# Patient Record
Sex: Female | Born: 1965 | Race: Black or African American | Hispanic: No | Marital: Married | Smoking: Never smoker
Health system: Southern US, Community
[De-identification: ages and names within clinical notes are randomized; demographics above are authoritative.]

## PROBLEM LIST (undated history)

## (undated) DIAGNOSIS — K219 Gastro-esophageal reflux disease without esophagitis: Secondary | ICD-10-CM

## (undated) DIAGNOSIS — E119 Type 2 diabetes mellitus without complications: Secondary | ICD-10-CM

## (undated) DIAGNOSIS — Z789 Other specified health status: Secondary | ICD-10-CM

## (undated) HISTORY — DX: Type 2 diabetes mellitus without complications: E11.9

## (undated) HISTORY — PX: TONSILLECTOMY: SUR1361

---

## 2001-05-17 ENCOUNTER — Emergency Department (HOSPITAL_COMMUNITY): Admission: EM | Admit: 2001-05-17 | Discharge: 2001-05-17 | Payer: Self-pay | Admitting: Emergency Medicine

## 2001-05-17 ENCOUNTER — Encounter: Payer: Self-pay | Admitting: Emergency Medicine

## 2002-06-24 ENCOUNTER — Emergency Department (HOSPITAL_COMMUNITY): Admission: EM | Admit: 2002-06-24 | Discharge: 2002-06-24 | Payer: Self-pay | Admitting: Emergency Medicine

## 2004-12-08 ENCOUNTER — Emergency Department (HOSPITAL_COMMUNITY): Admission: EM | Admit: 2004-12-08 | Discharge: 2004-12-08 | Payer: Self-pay | Admitting: Emergency Medicine

## 2006-12-03 ENCOUNTER — Ambulatory Visit: Payer: Self-pay | Admitting: Family Medicine

## 2006-12-07 ENCOUNTER — Encounter: Admission: RE | Admit: 2006-12-07 | Discharge: 2006-12-07 | Payer: Self-pay | Admitting: Family Medicine

## 2007-01-04 ENCOUNTER — Ambulatory Visit: Payer: Self-pay | Admitting: Family Medicine

## 2007-09-21 ENCOUNTER — Ambulatory Visit: Payer: Self-pay | Admitting: Family Medicine

## 2007-12-23 ENCOUNTER — Ambulatory Visit: Payer: Self-pay | Admitting: Family Medicine

## 2008-02-02 ENCOUNTER — Encounter: Payer: Self-pay | Admitting: Family Medicine

## 2008-02-02 ENCOUNTER — Ambulatory Visit: Payer: Self-pay | Admitting: Family Medicine

## 2008-02-02 ENCOUNTER — Other Ambulatory Visit: Admission: RE | Admit: 2008-02-02 | Discharge: 2008-02-02 | Payer: Self-pay | Admitting: Family Medicine

## 2008-03-16 ENCOUNTER — Ambulatory Visit: Payer: Self-pay | Admitting: Family Medicine

## 2008-09-06 ENCOUNTER — Ambulatory Visit: Payer: Self-pay | Admitting: Family Medicine

## 2009-03-07 ENCOUNTER — Emergency Department (HOSPITAL_COMMUNITY): Admission: EM | Admit: 2009-03-07 | Discharge: 2009-03-07 | Payer: Self-pay | Admitting: Family Medicine

## 2012-09-09 ENCOUNTER — Other Ambulatory Visit: Payer: Self-pay | Admitting: Family Medicine

## 2012-09-09 DIAGNOSIS — Z1231 Encounter for screening mammogram for malignant neoplasm of breast: Secondary | ICD-10-CM

## 2012-09-30 ENCOUNTER — Ambulatory Visit: Payer: Self-pay

## 2015-04-24 ENCOUNTER — Encounter (HOSPITAL_COMMUNITY): Payer: Self-pay | Admitting: *Deleted

## 2015-04-24 ENCOUNTER — Inpatient Hospital Stay (HOSPITAL_COMMUNITY)
Admission: EM | Admit: 2015-04-24 | Discharge: 2015-04-28 | DRG: 638 | Disposition: A | Discharge status: Home / Self Care | Payer: Self-pay | Attending: Internal Medicine | Admitting: Internal Medicine

## 2015-04-24 DIAGNOSIS — E1310 Other specified diabetes mellitus with ketoacidosis without coma: Principal | ICD-10-CM | POA: Diagnosis present

## 2015-04-24 DIAGNOSIS — E1110 Type 2 diabetes mellitus with ketoacidosis without coma: Secondary | ICD-10-CM | POA: Diagnosis present

## 2015-04-24 DIAGNOSIS — R42 Dizziness and giddiness: Secondary | ICD-10-CM

## 2015-04-24 DIAGNOSIS — I951 Orthostatic hypotension: Secondary | ICD-10-CM | POA: Diagnosis present

## 2015-04-24 DIAGNOSIS — E861 Hypovolemia: Secondary | ICD-10-CM | POA: Diagnosis present

## 2015-04-24 DIAGNOSIS — N179 Acute kidney failure, unspecified: Secondary | ICD-10-CM | POA: Diagnosis present

## 2015-04-24 DIAGNOSIS — E860 Dehydration: Secondary | ICD-10-CM | POA: Diagnosis present

## 2015-04-24 DIAGNOSIS — Z833 Family history of diabetes mellitus: Secondary | ICD-10-CM

## 2015-04-24 DIAGNOSIS — R079 Chest pain, unspecified: Secondary | ICD-10-CM

## 2015-04-24 DIAGNOSIS — E871 Hypo-osmolality and hyponatremia: Secondary | ICD-10-CM | POA: Diagnosis not present

## 2015-04-24 DIAGNOSIS — K219 Gastro-esophageal reflux disease without esophagitis: Secondary | ICD-10-CM | POA: Diagnosis present

## 2015-04-24 HISTORY — DX: Gastro-esophageal reflux disease without esophagitis: K21.9

## 2015-04-24 HISTORY — DX: Other specified health status: Z78.9

## 2015-04-24 LAB — CBG MONITORING, ED: Glucose-Capillary: 275 mg/dL — ABNORMAL HIGH (ref 65–99)

## 2015-04-24 MED ORDER — SODIUM CHLORIDE 0.9 % IV BOLUS (SEPSIS)
1000.0000 mL | Freq: Once | INTRAVENOUS | Status: AC
  Administered 2015-04-24: 1000 mL via INTRAVENOUS

## 2015-04-24 MED ORDER — PANTOPRAZOLE SODIUM 40 MG IV SOLR
40.0000 mg | Freq: Once | INTRAVENOUS | Status: AC
  Administered 2015-04-24: 40 mg via INTRAVENOUS
  Filled 2015-04-24: qty 40

## 2015-04-24 MED ORDER — ONDANSETRON HCL 4 MG/2ML IJ SOLN
4.0000 mg | Freq: Once | INTRAMUSCULAR | Status: AC
  Administered 2015-04-24: 4 mg via INTRAVENOUS
  Filled 2015-04-24: qty 2

## 2015-04-24 NOTE — ED Notes (Signed)
Pt has all blood work and urine results from urgent care with her from today.

## 2015-04-24 NOTE — ED Notes (Signed)
Pt actively vomiting.

## 2015-04-24 NOTE — ED Notes (Signed)
Pt was at [facility] and sent to ED for IV fluids d/t hyperglycemia. Pt diagnosed with DM at [facility] and prescribed Metformin.

## 2015-04-24 NOTE — ED Provider Notes (Signed)
CSN: [id number]     Arrival date & time 04/24/15  1956 History   First MD Initiated Contact with Patient 04/24/15 2254     Chief Complaint  Patient presents with  . Hyperglycemia     (Consider location/radiation/quality/duration/timing/severity/associated sxs/prior Treatment) HPI Comments: Patient seen at urgent care today for evaluation of fatigue, increased thirst and urination onset over the last several weeks.  CBG noted to be greater than 275.  Sent to ED for IV fluids.  Patient is a 49 y.o. female presenting with hyperglycemia. The history is provided by the patient. No language interpreter was used.  Hyperglycemia Blood sugar level PTA:  275 Severity:  Mild Onset quality:  Sudden Duration:  1 day Chronicity:  New Diabetes status:  Non-diabetic Context: new diabetes diagnosis   Associated symptoms: fatigue, increased thirst and polyuria   Risk factors: no family hx of diabetes     Past Medical History  Diagnosis Date  . Acid reflux    History reviewed. No pertinent past surgical history. No family history on file. Social History  Substance Use Topics  . Smoking status: Never Smoker   . Smokeless tobacco: None  . Alcohol Use: No   OB History    No data available     Review of Systems  Constitutional: Positive for fatigue.  Endocrine: Positive for polydipsia and polyuria.  All other systems reviewed and are negative.     Allergies  Review of patient's allergies indicates no known allergies.  Home Medications   Prior to Admission medications   Medication Sig Start Date End Date Taking? Authorizing Provider  metFORMIN (GLUCOPHAGE) 500 MG tablet Take 500 mg by mouth 2 (two) times daily. 04/24/15 04/23/16 Yes Historical Provider, MD  ranitidine (ZANTAC) 150 MG tablet Take 150 mg by mouth 2 (two) times daily.    Yes Historical Provider, MD   BP 181/107 mmHg  Pulse 108  Temp(Src) 98.7 F (37.1 C) (Oral)  Resp 18  Ht  (1.753 m)  Wt 247 lb (112.038 kg)   BMI 36.46 kg/m2  SpO2 99%  LMP 04/10/2015 Physical Exam  Constitutional: She is oriented to person, place, and time. She appears well-developed and well-nourished.  HENT:  Head: Normocephalic.  Eyes: Conjunctivae are normal.  Neck: Neck supple.  Cardiovascular: Normal rate and regular rhythm.   Pulmonary/Chest: Effort normal and breath sounds normal.  Abdominal: Soft.  Musculoskeletal: She exhibits no edema or tenderness.  Neurological: She is alert and oriented to person, place, and time.  Skin: Skin is warm and dry.  Psychiatric: She has a normal mood and affect.  Nursing note and vitals reviewed.   ED Course  Procedures (including critical care time) Labs Review Labs Reviewed  BASIC METABOLIC PANEL - Abnormal; Notable for the following:    CO2 12 (*)    Glucose, Bld 276 (*)    Creatinine, Ser 1.37 (*)    GFR calc non Af Amer 44 (*)    GFR calc Af Amer 52 (*)    Anion gap 18 (*)    All other components within normal limits  CBG MONITORING, ED - Abnormal; Notable for the following:    Glucose-Capillary 275 (*)    All other components within normal limits  CBG MONITORING, ED - Abnormal; Notable for the following:    Glucose-Capillary 243 (*)    All other components within normal limits    Imaging Review No results found. I have personally reviewed and evaluated these images and lab results as  part of my medical decision-making.   EKG Interpretation None     Patient with new onset diabetes, CBG >300 at urgent care today. Has been nauseated, emesis onset in ED. Labs reviewed, current anion gap of 18. Patient has received 1 liter of NS, with current CBG of 243.  Discussed with Dr. Daria Pastures start on insulin drip and request admission. MDM   Final diagnoses:  None    New onset DM. Anion gag acidosis.    Felicie Morn, NP 04/25/15 8119  Derwood Kaplan, MD 04/26/15 1004

## 2015-04-25 ENCOUNTER — Inpatient Hospital Stay (HOSPITAL_COMMUNITY): Payer: Self-pay

## 2015-04-25 ENCOUNTER — Encounter (HOSPITAL_COMMUNITY): Payer: Self-pay | Admitting: Internal Medicine

## 2015-04-25 DIAGNOSIS — E1310 Other specified diabetes mellitus with ketoacidosis without coma: Principal | ICD-10-CM

## 2015-04-25 DIAGNOSIS — R42 Dizziness and giddiness: Secondary | ICD-10-CM

## 2015-04-25 DIAGNOSIS — N179 Acute kidney failure, unspecified: Secondary | ICD-10-CM | POA: Insufficient documentation

## 2015-04-25 DIAGNOSIS — R739 Hyperglycemia, unspecified: Secondary | ICD-10-CM | POA: Insufficient documentation

## 2015-04-25 DIAGNOSIS — E1110 Type 2 diabetes mellitus with ketoacidosis without coma: Secondary | ICD-10-CM | POA: Diagnosis present

## 2015-04-25 LAB — BASIC METABOLIC PANEL
ANION GAP: 12 (ref 5–15)
ANION GAP: 14 (ref 5–15)
ANION GAP: 15 (ref 5–15)
ANION GAP: 18 — AB (ref 5–15)
Anion gap: 11 (ref 5–15)
Anion gap: 11 (ref 5–15)
Anion gap: 10 (ref 5–15)
BUN: 7 mg/dL (ref 6–20)
BUN: 7 mg/dL (ref 6–20)
BUN: 7 mg/dL (ref 6–20)
BUN: 8 mg/dL (ref 6–20)
BUN: 6 mg/dL (ref 6–20)
BUN: 6 mg/dL (ref 6–20)
BUN: 9 mg/dL (ref 6–20)
CALCIUM: 8 mg/dL — AB (ref 8.9–10.3)
CALCIUM: 8.3 mg/dL — AB (ref 8.9–10.3)
CALCIUM: 8.4 mg/dL — AB (ref 8.9–10.3)
CALCIUM: 8.4 mg/dL — AB (ref 8.9–10.3)
CALCIUM: 9.1 mg/dL (ref 8.9–10.3)
CHLORIDE: 107 mmol/L (ref 101–111)
CHLORIDE: 109 mmol/L (ref 101–111)
CO2: 11 mmol/L — AB (ref 22–32)
CO2: 12 mmol/L — AB (ref 22–32)
CO2: 12 mmol/L — ABNORMAL LOW (ref 22–32)
CO2: 12 mmol/L — ABNORMAL LOW (ref 22–32)
CO2: 13 mmol/L — AB (ref 22–32)
CO2: 16 mmol/L — ABNORMAL LOW (ref 22–32)
CO2: 17 mmol/L — ABNORMAL LOW (ref 22–32)
CREATININE: 1.19 mg/dL — AB (ref 0.44–1.00)
CREATININE: 1.21 mg/dL — AB (ref 0.44–1.00)
CREATININE: 1.18 mg/dL — AB (ref 0.44–1.00)
CREATININE: 1.06 mg/dL — AB (ref 0.44–1.00)
Calcium: 8.7 mg/dL — ABNORMAL LOW (ref 8.9–10.3)
Calcium: 8.7 mg/dL — ABNORMAL LOW (ref 8.9–10.3)
Chloride: 105 mmol/L (ref 101–111)
Chloride: 107 mmol/L (ref 101–111)
Chloride: 110 mmol/L (ref 101–111)
Chloride: 110 mmol/L (ref 101–111)
Chloride: 113 mmol/L — ABNORMAL HIGH (ref 101–111)
Creatinine, Ser: 0.97 mg/dL (ref 0.44–1.00)
Creatinine, Ser: 0.98 mg/dL (ref 0.44–1.00)
Creatinine, Ser: 1.37 mg/dL — ABNORMAL HIGH (ref 0.44–1.00)
GFR calc Af Amer: 52 mL/min — ABNORMAL LOW (ref >60)
GFR calc Af Amer: 60 mL/min — ABNORMAL LOW (ref >60)
GFR calc Af Amer: >60 mL/min (ref >60)
GFR calc Af Amer: >60 mL/min (ref >60)
GFR calc Af Amer: >60 mL/min (ref >60)
GFR calc Af Amer: >60 mL/min (ref >60)
GFR calc Af Amer: >60 mL/min (ref >60)
GFR calc non Af Amer: >60 mL/min (ref >60)
GFR, EST NON AFRICAN AMERICAN: 52 mL/min — AB (ref >60)
GFR, EST NON AFRICAN AMERICAN: 53 mL/min — AB (ref >60)
GFR, EST NON AFRICAN AMERICAN: 53 mL/min — AB (ref >60)
GFR, EST NON AFRICAN AMERICAN: 44 mL/min — AB (ref >60)
GLUCOSE: 176 mg/dL — AB (ref 65–99)
GLUCOSE: 189 mg/dL — AB (ref 65–99)
GLUCOSE: 228 mg/dL — AB (ref 65–99)
GLUCOSE: 249 mg/dL — AB (ref 65–99)
GLUCOSE: 128 mg/dL — AB (ref 65–99)
GLUCOSE: 148 mg/dL — AB (ref 65–99)
GLUCOSE: 276 mg/dL — AB (ref 65–99)
POTASSIUM: 3.8 mmol/L (ref 3.5–5.1)
POTASSIUM: 3.5 mmol/L (ref 3.5–5.1)
Potassium: 3.6 mmol/L (ref 3.5–5.1)
Potassium: 3.7 mmol/L (ref 3.5–5.1)
Potassium: 3.8 mmol/L (ref 3.5–5.1)
Potassium: 4.1 mmol/L (ref 3.5–5.1)
Potassium: 4.3 mmol/L (ref 3.5–5.1)
Sodium: 132 mmol/L — ABNORMAL LOW (ref 135–145)
Sodium: 134 mmol/L — ABNORMAL LOW (ref 135–145)
Sodium: 135 mmol/L (ref 135–145)
Sodium: 135 mmol/L (ref 135–145)
Sodium: 135 mmol/L (ref 135–145)
Sodium: 137 mmol/L (ref 135–145)
Sodium: 137 mmol/L (ref 135–145)

## 2015-04-25 LAB — PREGNANCY, URINE: Preg Test, Ur: NEGATIVE

## 2015-04-25 LAB — CBC WITH DIFFERENTIAL/PLATELET
BASOS ABS: 0 10*3/uL (ref 0.0–0.1)
Basophils Relative: 0 % (ref 0–1)
EOS ABS: 0.1 10*3/uL (ref 0.0–0.7)
EOS PCT: 1 % (ref 0–5)
HCT: 42.4 % (ref 36.0–46.0)
HEMOGLOBIN: 15.1 g/dL — AB (ref 12.0–15.0)
LYMPHS PCT: 34 % (ref 12–46)
Lymphs Abs: 2.2 10*3/uL (ref 0.7–4.0)
MCH: 30.6 pg (ref 26.0–34.0)
MCHC: 35.6 g/dL (ref 30.0–36.0)
MCV: 86 fL (ref 78.0–100.0)
Monocytes Absolute: 0.7 10*3/uL (ref 0.1–1.0)
Monocytes Relative: 10 % (ref 3–12)
NEUTROS PCT: 55 % (ref 43–77)
Neutro Abs: 3.5 10*3/uL (ref 1.7–7.7)
PLATELETS: 193 10*3/uL (ref 150–400)
RBC: 4.93 MIL/uL (ref 3.87–5.11)
RDW: 13.2 % (ref 11.5–15.5)
WBC: 6.5 10*3/uL (ref 4.0–10.5)

## 2015-04-25 LAB — GLUCOSE, CAPILLARY
GLUCOSE-CAPILLARY: 226 mg/dL — AB (ref 65–99)
GLUCOSE-CAPILLARY: 235 mg/dL — AB (ref 65–99)
GLUCOSE-CAPILLARY: 231 mg/dL — AB (ref 65–99)
GLUCOSE-CAPILLARY: 144 mg/dL — AB (ref 65–99)
GLUCOSE-CAPILLARY: 152 mg/dL — AB (ref 65–99)
GLUCOSE-CAPILLARY: 195 mg/dL — AB (ref 65–99)
GLUCOSE-CAPILLARY: 181 mg/dL — AB (ref 65–99)
GLUCOSE-CAPILLARY: 138 mg/dL — AB (ref 65–99)
GLUCOSE-CAPILLARY: 120 mg/dL — AB (ref 65–99)
Glucose-Capillary: 223 mg/dL — ABNORMAL HIGH (ref 65–99)
Glucose-Capillary: 238 mg/dL — ABNORMAL HIGH (ref 65–99)
Glucose-Capillary: 210 mg/dL — ABNORMAL HIGH (ref 65–99)
Glucose-Capillary: 194 mg/dL — ABNORMAL HIGH (ref 65–99)
Glucose-Capillary: 186 mg/dL — ABNORMAL HIGH (ref 65–99)
Glucose-Capillary: 147 mg/dL — ABNORMAL HIGH (ref 65–99)
Glucose-Capillary: 139 mg/dL — ABNORMAL HIGH (ref 65–99)
Glucose-Capillary: 161 mg/dL — ABNORMAL HIGH (ref 65–99)
Glucose-Capillary: 172 mg/dL — ABNORMAL HIGH (ref 65–99)

## 2015-04-25 LAB — RAPID URINE DRUG SCREEN, HOSP PERFORMED
Amphetamines: NOT DETECTED
BARBITURATES: NOT DETECTED
Benzodiazepines: NOT DETECTED
Cocaine: NOT DETECTED
Opiates: NOT DETECTED
TETRAHYDROCANNABINOL: NOT DETECTED

## 2015-04-25 LAB — URINE MICROSCOPIC-ADD ON

## 2015-04-25 LAB — URINALYSIS, ROUTINE W REFLEX MICROSCOPIC
BILIRUBIN URINE: NEGATIVE
Leukocytes, UA: NEGATIVE
NITRITE: NEGATIVE
PH: 5 (ref 5.0–8.0)
Protein, ur: 30 mg/dL — AB
Specific Gravity, Urine: 1.029 (ref 1.005–1.030)
Urobilinogen, UA: 0.2 mg/dL (ref 0.0–1.0)

## 2015-04-25 LAB — HEPATIC FUNCTION PANEL
ALBUMIN: 3.8 g/dL (ref 3.5–5.0)
ALT: 35 U/L (ref 14–54)
AST: 26 U/L (ref 15–41)
Alkaline Phosphatase: 96 U/L (ref 38–126)
BILIRUBIN INDIRECT: 0.9 mg/dL (ref 0.3–0.9)
Bilirubin, Direct: 0.1 mg/dL (ref 0.1–0.5)
TOTAL PROTEIN: 7.2 g/dL (ref 6.5–8.1)
Total Bilirubin: 1 mg/dL (ref 0.3–1.2)

## 2015-04-25 LAB — CBG MONITORING, ED
GLUCOSE-CAPILLARY: 251 mg/dL — AB (ref 65–99)
GLUCOSE-CAPILLARY: 243 mg/dL — AB (ref 65–99)

## 2015-04-25 LAB — TROPONIN I: Troponin I: <0.03 ng/mL (ref <0.031)

## 2015-04-25 LAB — TSH: TSH: 1.681 u[IU]/mL (ref 0.350–4.500)

## 2015-04-25 MED ORDER — DEXTROSE-NACL 5-0.45 % IV SOLN: INTRAVENOUS | Status: DC

## 2015-04-25 MED ORDER — SODIUM CHLORIDE 0.9 % IV SOLN
INTRAVENOUS | Status: DC
  Administered 2015-04-25: 1.9 [IU]/h via INTRAVENOUS
  Filled 2015-04-25: qty 2.5

## 2015-04-25 MED ORDER — ENOXAPARIN SODIUM 40 MG/0.4ML ~~LOC~~ SOLN
40.0000 mg | SUBCUTANEOUS | Status: DC
  Administered 2015-04-25: 40 mg via SUBCUTANEOUS
  Filled 2015-04-25 (×2): qty 0.4

## 2015-04-25 MED ORDER — DEXTROSE-NACL 5-0.45 % IV SOLN
INTRAVENOUS | Status: DC
  Administered 2015-04-25: 02:00:00 via INTRAVENOUS

## 2015-04-25 MED ORDER — SODIUM CHLORIDE 0.9 % IV SOLN: INTRAVENOUS | Status: DC

## 2015-04-25 MED ORDER — SODIUM CHLORIDE 0.9 % IV SOLN: 1000.0000 mL | Freq: Once | INTRAVENOUS | Status: DC

## 2015-04-25 MED ORDER — DEXTROSE-NACL 5-0.45 % IV SOLN
INTRAVENOUS | Status: DC
  Administered 2015-04-25: 13:00:00 via INTRAVENOUS

## 2015-04-25 MED ORDER — INSULIN REGULAR HUMAN 100 UNIT/ML IJ SOLN
INTRAMUSCULAR | Status: DC
  Administered 2015-04-25: 3.4 [IU]/h via INTRAVENOUS
  Administered 2015-04-26: 2.5 [IU]/h via INTRAVENOUS
  Administered 2015-04-26: 1.9 [IU]/h via INTRAVENOUS
  Administered 2015-04-26: 3.9 [IU]/h via INTRAVENOUS
  Administered 2015-04-26: 2.5 [IU]/h via INTRAVENOUS
  Filled 2015-04-25: qty 2.5

## 2015-04-25 MED ORDER — POTASSIUM CHLORIDE 10 MEQ/100ML IV SOLN
10.0000 meq | INTRAVENOUS | Status: AC
  Administered 2015-04-25 (×2): 10 meq via INTRAVENOUS
  Filled 2015-04-25 (×6): qty 100

## 2015-04-25 MED ORDER — DEXTROSE 5 % IV SOLN
INTRAVENOUS | Status: DC
  Administered 2015-04-25 – 2015-04-27 (×4): via INTRAVENOUS

## 2015-04-25 MED ORDER — POTASSIUM CHLORIDE 10 MEQ/100ML IV SOLN
10.0000 meq | INTRAVENOUS | Status: AC
  Filled 2015-04-25 (×2): qty 100

## 2015-04-25 NOTE — ED Notes (Signed)
CBG 243 

## 2015-04-25 NOTE — ED Notes (Signed)
Patient transported to CT 

## 2015-04-25 NOTE — ED Notes (Signed)
Waiting on insulin drip from pharmacy. Sent pharmacy a note already.

## 2015-04-25 NOTE — Progress Notes (Signed)
[facility] PROGRESS NOTE Assessment/Plan: DKA, type 2/high anion gap acidosis: - Started on IV insulin drip, blood glucose slowly trending down still has an anion gap and her bicarbonate is still less than 20. - Continue B-mets every 4 CBG every hourly.  Orthostatic hypotension: - Continue IV fluids monitor strict I's and O's.  ARF (acute renal failure): - Likely prerenal continue IV fluids monitor creatinine.  Nausea and vomiting: Likely due to DKA  Hyponatremia: Likely due to hypovolemia continue IV hydration.  Code Status: full Family Communication: none  Disposition Plan: inpatient   Consultants:  none  Procedures:  CXR  Antibiotics:  None  HPI/Subjective: On the phone does not want to speak.  Objective: Filed Vitals:   04/24/15 2115 04/25/15 0019 04/25/15 0159 04/25/15 0228  BP: 181/107 136/66 132/78 131/84  Pulse: 108 91  89  Temp: 98.7 F (37.1 C)   98.2 F (36.8 C)  TempSrc: Oral   Oral  Resp: 18 18 18 18   Height: 5\' 9"  (1.753 m)     Weight: 112.038 kg (247 lb)     SpO2: 99% 100% 99% 100%    Intake/Output Summary (Last 24 hours) at 04/25/15 1033 Last data filed at 04/25/15 0800  Gross per 24 hour  Intake      0 ml  Output      0 ml  Net      0 ml   Filed Weights   04/24/15 2115  Weight: 112.038 kg (247 lb)    Exam:  General: Alert, awake, oriented x3, in no acute distress.  HEENT: No bruits, no goiter.  Heart: Regular rate and rhythm. Lungs: Good air movement, clear Abdomen: Soft, nontender, nondistended, positive bowel sounds.  Neuro: Grossly intact, nonfocal.   Data Reviewed: Basic Metabolic Panel:  Recent Labs Lab 04/24/15 2332 04/25/15 0258 04/25/15 0615  NA 135 137 132*  K 4.3 3.8 4.1  CL 105 110 107  CO2 12* 12* 11*  GLUCOSE 276* 249* 228*  BUN 9 8 7   CREATININE 1.37* 1.19* 1.21*  CALCIUM 9.1 8.3* 8.0*   Liver Function Tests:  Recent Labs Lab 04/25/15 0258  AST 26  ALT 35  ALKPHOS 96    BILITOT 1.0  PROT 7.2  ALBUMIN 3.8   No results for input(s): LIPASE, AMYLASE in the last 168 hours. No results for input(s): AMMONIA in the last 168 hours. CBC:  Recent Labs Lab 04/25/15 0258  WBC 6.5  NEUTROABS 3.5  HGB 15.1*  HCT 42.4  MCV 86.0  PLT 193   Cardiac Enzymes:  Recent Labs Lab 04/25/15 0258 04/25/15 0800  TROPONINI <0.03 <0.03   BNP (last 3 results) No results for input(s): BNP in the last 8760 hours.  ProBNP (last 3 results) No results for input(s): PROBNP in the last 8760 hours.  CBG:  Recent Labs Lab 04/25/15 0513 04/25/15 0601 04/25/15 0655 04/25/15 0808 04/25/15 0928  GLUCAP 223* 231* 238* 210* 194*    No results found for this or any previous visit (from the past 240 hour(s)).   Studies: Ct Head Wo Contrast  04/25/2015   CLINICAL DATA:  Subacute onset of dizziness for several weeks. Initial encounter.  EXAM: CT HEAD WITHOUT CONTRAST  TECHNIQUE: Contiguous axial images were obtained from the base of the skull through the vertex without intravenous contrast.  COMPARISON:  None.  FINDINGS: There is no evidence of acute infarction, mass lesion, or intra- or extra-axial hemorrhage on CT.  Mild periventricular white matter change may  reflect small vessel ischemic microangiopathy.  The posterior fossa, including the cerebellum, brainstem and fourth ventricle, is within normal limits. The third and lateral ventricles, and basal ganglia are unremarkable in appearance. The cerebral hemispheres are symmetric in appearance, with normal gray-white differentiation. No mass effect or midline shift is seen.  There is no evidence of fracture; visualized osseous structures are unremarkable in appearance. The orbits are within normal limits. The paranasal sinuses and mastoid air cells are well-aerated. No significant soft tissue abnormalities are seen.  IMPRESSION: 1. No acute intracranial pathology seen on CT. 2. 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   Electronically Signed   By: Roanna Raider M.D.   On: 04/25/2015 02:26   Dg Chest Port 1 View  04/25/2015   CLINICAL DATA:  49 year old female with hyperglycemia, chest pain. Initial encounter.  EXAM: PORTABLE CHEST - 1 VIEW  COMPARISON:  None.  FINDINGS: Portable AP upright view at 0632 hours. Low lung volumes. The lungs remain clear. Normal cardiac size and mediastinal contours. No pneumothorax or pleural effusion.  IMPRESSION: Low lung volumes, otherwise no acute cardiopulmonary abnormality.   Electronically Signed   By: Odessa Fleming M.D.   On: 04/25/2015 08:04    Scheduled Meds: . dextrose 5 % and 0.45% NaCl   Intravenous STAT  . enoxaparin (LOVENOX) injection  40 mg Subcutaneous Q24H   Continuous Infusions: . sodium chloride    . dextrose 5 % and 0.45% NaCl    . insulin (NOVOLIN-R) infusion 4 Units/hr (04/25/15 0930)    Time Spent: 25 min   Marinda Elk  [facility] Pager [phone]. If 7PM-7AM, please contact night-coverage at [email or web address], password [email or web address] 04/25/2015, 10:33 AM  LOS: 0 days

## 2015-04-25 NOTE — Progress Notes (Signed)
UR Completed. Lebron Nauert, RN, BSN.  [phone] 

## 2015-04-25 NOTE — Progress Notes (Signed)
Inpatient Diabetes Program Recommendations  AACE/ADA: New Consensus Statement on Inpatient Glycemic Control (2013)  Target Ranges:  Prepandial:   less than 140 mg/dL      Peak postprandial:   less than 180 mg/dL (1-2 hours)      Critically ill patients:  140 - 180 mg/dL   Reason for Admission: DKA  Diabetes history: DM 2 Outpatient Diabetes medications: Metformin 500 mg BID Current orders for Inpatient glycemic control: insulin gtt  A1c 11.7% per urgent care records  Note: Patient's CO2 11 at 0615 am, very acidotic still. Recommend keeping patient on insulin gtt until acidosis clears.  Thanks,  Christena Deem RN, MSN, [facility] Inpatient Diabetes Coordinator Team Pager [phone]

## 2015-04-25 NOTE — Care Management Note (Signed)
Case Management Note  Patient Details  Name: Cathy Waters MRN: [id number] Date of Birth: August 24, 1966  Subjective/Objective:    Pt admitted with DKA                Action/Plan:  Pt is independent from home and has just recently became unemployed.  Pt verified that she doesn't have insurance nor PCP.  CM will assist monitor for disposition needs   Expected Discharge Date:  04/26/15               Expected Discharge Plan:  Home/Self Care  In-House Referral:     Discharge planning Services  CM Consult, Indigent [facility] Clinic  Post Acute Care Choice:    Choice offered to:     DME Arranged:    DME Agency:     HH Arranged:    HH Agency:     Status of Service:  In process, will continue to follow  Medicare Important Message Given:    Date Medicare IM Given:    Medicare IM give by:    Date Additional Medicare IM Given:    Additional Medicare Important Message give by:     If discussed at Long Length of Stay Meetings, dates discussed:    Additional Comments: CM assessed pt.  Pt agreed for CM to arrange appointment with [facility], appointment scheduled for 05/02/15 at 8:15am at the [facility] located [address] [place] in [place].  CM provided pt with brochure and appointment information.  CM will continue to monitor Cherylann Parr, RN 04/25/2015, 10:14 AM

## 2015-04-25 NOTE — Progress Notes (Signed)
PATIENT ARRIVED TO UNIT 2W FROM E.D. VIA STRETCHER. AMBULATED TO BED. TELE APPLIED. VITALS OBTAINED. ASSESSMENT PERFORMED.  PATIENT ORIENTED TO UNIT AND EQUIPMENT AND INSTRUCTED TO CALL FOR ASSISTANCE WHEN NEEDED.

## 2015-04-25 NOTE — H&P (Signed)
[facility] History and Physical  NAILANI FULL ZOX:[id number] DOB: July 02, 1966 DOA: 04/24/2015  Referring physician: Mr.Smith. PCP: No primary care provider on file.  Specialists: None.  Chief Complaint: Dizziness.  HPI: Cathy Waters is a 49 y.o. female with no significant past medical history he has been experiencing increasing dizziness with blurred vision over the last few days with polyuria and polydipsia with throat burning sensation and had 2 episodes of nausea and vomiting yesterday morning. Patient had gone to the urgent care center and was found to have elevated blood sugar with anion gap and hemoglobin A1c was 11.7. Patient is referred to the ER and in the ER patient was found to have elevated anion gap with elevated blood sugar and has been admitted for DKA. Patient states over last few days patient also has been having throat burning sensation denies any chest pain or shortness of breath. Patient feels dizzy only on standing up and is not sure if she passed out as per the patient. Denies any abdominal pain. Exam in the ER appears nonfocal.  Review of Systems: As presented in the history of presenting illness, rest negative.  Past Medical History  Diagnosis Date  . Acid reflux   . Medical history non-contributory    Past Surgical History  Procedure Laterality Date  . Tonsillectomy     Social History:  reports that she has never smoked. She does not have any smokeless tobacco history on file. She reports that she does not drink alcohol or use illicit drugs. Where does patient live home. Can patient participate in ADLs? Yes.  No Known Allergies  Family History:  Family History  Problem Relation Age of Onset  . Diabetes Mellitus II Maternal Uncle       Prior to Admission medications   Medication Sig Start Date End Date Taking? Authorizing Provider  metFORMIN (GLUCOPHAGE) 500 MG tablet Take 500 mg by mouth 2 (two) times daily. 04/24/15 04/23/16 Yes Historical  Provider, MD  ranitidine (ZANTAC) 150 MG tablet Take 150 mg by mouth 2 (two) times daily.    Yes Historical Provider, MD    Physical Exam: Filed Vitals:   04/24/15 2115 04/25/15 0019  BP: 181/107 136/66  Pulse: 108 91  Temp: 98.7 F (37.1 C)   TempSrc: Oral   Resp: 18 18  Height:  (1.753 m)   Weight: 112.038 kg (247 lb)   SpO2: 99% 100%     General:  Moderately built and nourished.  Eyes: Anicteric. No pallor.  ENT: No discharge from the ears eyes nose and mouth.  Neck: No mass felt.  Cardiovascular: S1-S2 heard.  Respiratory: No rhonchi or crepitations.  Abdomen: Soft nontender bowel sounds present.  Skin: No rash.  Musculoskeletal: No edema.  Psychiatric: Appears normal.  Neurologic: Alert awake oriented to time place and person. Moves all extremities 5 x 5. No facial asymmetry. Tongue was midline.  Labs on Admission:  Basic Metabolic Panel:  Recent Labs Lab 04/24/15 2332  NA 135  K 4.3  CL 105  CO2 12*  GLUCOSE 276*  BUN 9  CREATININE 1.37*  CALCIUM 9.1   Liver Function Tests: No results for input(s): AST, ALT, ALKPHOS, BILITOT, PROT, ALBUMIN in the last 168 hours. No results for input(s): LIPASE, AMYLASE in the last 168 hours. No results for input(s): AMMONIA in the last 168 hours. CBC: No results for input(s): WBC, NEUTROABS, HGB, HCT, MCV, PLT in the last 168 hours. Cardiac Enzymes: No results for input(s): CKTOTAL, CKMB,  CKMBINDEX, TROPONINI in the last 168 hours.  BNP (last 3 results) No results for input(s): BNP in the last 8760 hours.  ProBNP (last 3 results) No results for input(s): PROBNP in the last 8760 hours.  CBG:  Recent Labs Lab 04/24/15 2116 04/25/15 0022  GLUCAP 275* 243*    Radiological Exams on Admission: No results found.  Assessment/Plan Principal Problem:   DKA, type 2 Active Problems:   Dizziness   ARF (acute renal failure)   DKA (diabetic ketoacidoses)   1. Diabetes ketoacidosis and new onset  diabetes mellitus type 2 - patient has been placed on IV bolus fluid and started on IV insulin infusion. Hemoglobin A1c checked in urgent care center was 11.7. Continue with IV insulin infusion until anion gap is corrected. Closely follow metabolic panel intake output. Check troponins. 2. Dizziness - probably from dehydration since patient is giving history of orthostatic symptoms. CT head is pending. Continue with hydration and check orthostatics in a.m. 3. Acute renal failure - probably from dehydration. Baseline creatinine not known. Closely follow metabolic panel. Check urinalysis. 4. Throat burning sensation - check cardiac markers. Presently has no pain. Could be from esophagitis. 5. Nausea vomiting - probably from DKA. Abdomen appears benign. Check LFTs.  CBC at the urgent care looked unremarkable. EKG chest x-ray and CT head are pending. I have reviewed documents from the urgent care center.   DVT Prophylaxis Lovenox if CT head is negative.  Code Status: Full code.  Family Communication: Discussed with patient's sister at the bedside.  Disposition Plan: Admit to inpatient.    Rozelia Catapano N. [facility] Pager [phone].  If 7PM-7AM, please contact night-coverage [email or web address] Password [email or web address] 04/25/2015, 1:27 AM

## 2015-04-26 LAB — BASIC METABOLIC PANEL
ANION GAP: 12 (ref 5–15)
Anion gap: 11 (ref 5–15)
Anion gap: 10 (ref 5–15)
Anion gap: 13 (ref 5–15)
Anion gap: 14 (ref 5–15)
BUN: 5 mg/dL — AB (ref 6–20)
BUN: <5 mg/dL — ABNORMAL LOW (ref 6–20)
BUN: 6 mg/dL (ref 6–20)
CALCIUM: 8.7 mg/dL — AB (ref 8.9–10.3)
CALCIUM: 8.7 mg/dL — AB (ref 8.9–10.3)
CHLORIDE: 109 mmol/L (ref 101–111)
CHLORIDE: 108 mmol/L (ref 101–111)
CO2: 15 mmol/L — ABNORMAL LOW (ref 22–32)
CO2: 17 mmol/L — ABNORMAL LOW (ref 22–32)
CO2: 16 mmol/L — ABNORMAL LOW (ref 22–32)
CO2: 15 mmol/L — AB (ref 22–32)
CO2: 16 mmol/L — AB (ref 22–32)
CREATININE: 1.09 mg/dL — AB (ref 0.44–1.00)
CREATININE: 1.03 mg/dL — AB (ref 0.44–1.00)
Calcium: 8.9 mg/dL (ref 8.9–10.3)
Calcium: 8.9 mg/dL (ref 8.9–10.3)
Calcium: 8.8 mg/dL — ABNORMAL LOW (ref 8.9–10.3)
Chloride: 105 mmol/L (ref 101–111)
Chloride: 104 mmol/L (ref 101–111)
Chloride: 104 mmol/L (ref 101–111)
Creatinine, Ser: 0.91 mg/dL (ref 0.44–1.00)
Creatinine, Ser: 0.83 mg/dL (ref 0.44–1.00)
Creatinine, Ser: 0.96 mg/dL (ref 0.44–1.00)
GFR calc Af Amer: >60 mL/min (ref >60)
GFR calc Af Amer: >60 mL/min (ref >60)
GFR calc Af Amer: >60 mL/min (ref >60)
GFR calc Af Amer: >60 mL/min (ref >60)
GFR calc non Af Amer: >60 mL/min (ref >60)
GFR calc non Af Amer: >60 mL/min (ref >60)
GFR calc non Af Amer: >60 mL/min (ref >60)
GFR, EST NON AFRICAN AMERICAN: 59 mL/min — AB (ref >60)
GLUCOSE: 176 mg/dL — AB (ref 65–99)
GLUCOSE: 271 mg/dL — AB (ref 65–99)
GLUCOSE: 319 mg/dL — AB (ref 65–99)
Glucose, Bld: 151 mg/dL — ABNORMAL HIGH (ref 65–99)
Glucose, Bld: 269 mg/dL — ABNORMAL HIGH (ref 65–99)
POTASSIUM: 3.3 mmol/L — AB (ref 3.5–5.1)
POTASSIUM: 3.5 mmol/L (ref 3.5–5.1)
POTASSIUM: 3.7 mmol/L (ref 3.5–5.1)
Potassium: 3.5 mmol/L (ref 3.5–5.1)
Potassium: 3.9 mmol/L (ref 3.5–5.1)
SODIUM: 135 mmol/L (ref 135–145)
SODIUM: 133 mmol/L — AB (ref 135–145)
Sodium: 135 mmol/L (ref 135–145)
Sodium: 132 mmol/L — ABNORMAL LOW (ref 135–145)
Sodium: 134 mmol/L — ABNORMAL LOW (ref 135–145)

## 2015-04-26 LAB — GLUCOSE, CAPILLARY
GLUCOSE-CAPILLARY: 197 mg/dL — AB (ref 65–99)
GLUCOSE-CAPILLARY: 177 mg/dL — AB (ref 65–99)
GLUCOSE-CAPILLARY: 190 mg/dL — AB (ref 65–99)
GLUCOSE-CAPILLARY: 143 mg/dL — AB (ref 65–99)
GLUCOSE-CAPILLARY: 153 mg/dL — AB (ref 65–99)
GLUCOSE-CAPILLARY: 203 mg/dL — AB (ref 65–99)
Glucose-Capillary: 131 mg/dL — ABNORMAL HIGH (ref 65–99)
Glucose-Capillary: 146 mg/dL — ABNORMAL HIGH (ref 65–99)
Glucose-Capillary: 180 mg/dL — ABNORMAL HIGH (ref 65–99)
Glucose-Capillary: 157 mg/dL — ABNORMAL HIGH (ref 65–99)
Glucose-Capillary: 137 mg/dL — ABNORMAL HIGH (ref 65–99)
Glucose-Capillary: 182 mg/dL — ABNORMAL HIGH (ref 65–99)
Glucose-Capillary: 143 mg/dL — ABNORMAL HIGH (ref 65–99)
Glucose-Capillary: 272 mg/dL — ABNORMAL HIGH (ref 65–99)
Glucose-Capillary: 239 mg/dL — ABNORMAL HIGH (ref 65–99)
Glucose-Capillary: 308 mg/dL — ABNORMAL HIGH (ref 65–99)

## 2015-04-26 MED ORDER — SODIUM CHLORIDE 0.9 % IV SOLN
INTRAVENOUS | Status: DC
  Administered 2015-04-26: 0.7 [IU]/h via INTRAVENOUS
  Filled 2015-04-26: qty 2.5

## 2015-04-26 MED ORDER — POTASSIUM CHLORIDE CRYS ER 20 MEQ PO TBCR
40.0000 meq | EXTENDED_RELEASE_TABLET | Freq: Two times a day (BID) | ORAL | Status: AC
  Administered 2015-04-26 (×2): 40 meq via ORAL
  Filled 2015-04-26 (×2): qty 2

## 2015-04-26 MED ORDER — POTASSIUM CHLORIDE 10 MEQ/100ML IV SOLN
10.0000 meq | INTRAVENOUS | Status: AC
  Administered 2015-04-26 (×3): 10 meq via INTRAVENOUS
  Filled 2015-04-26 (×3): qty 100

## 2015-04-26 NOTE — Progress Notes (Signed)
[facility] PROGRESS NOTE Assessment/Plan: DKA, type 2/high anion gap acidosis: - Continue insulin drip, still in DKA bicarbonate is only 15. We'll check urinary sodium, chloride and bicarbonate. - Continue B-mets every 4 CBG every hourly.  Orthostatic hypotension: - Continue IV fluids monitor strict I's and O's.  ARF (acute renal failure): - Likely prerenal continue IV fluids monitor creatinine.  Nausea and vomiting: Resolved, likely due to DKA.  Hyponatremia: Likely due to hypovolemia continue IV hydration.  Code Status: full Family Communication: none  Disposition Plan: inpatient   Consultants:  none  Procedures:  CXR  Antibiotics:  None  HPI/Subjective: No complains  Objective: Filed Vitals:   04/25/15 1045 04/25/15 1343 04/25/15 2207 04/26/15 0547  BP: 125/77 134/76 117/57 123/74  Pulse: 83 86 81 88  Temp: 97.9 F (36.6 C) 98.3 F (36.8 C) 98.2 F (36.8 C) 98.1 F (36.7 C)  TempSrc: Oral Oral Oral Oral  Resp: 20 18 18 18   Height:      Weight:      SpO2: 96% 100% 99% 100%    Intake/Output Summary (Last 24 hours) at 04/26/15 1136 Last data filed at 04/25/15 1754  Gross per 24 hour  Intake    720 ml  Output      0 ml  Net    720 ml   Filed Weights   04/24/15 2115  Weight: 112.038 kg (247 lb)    Exam:  General: Alert, awake, oriented x3, in no acute distress.  HEENT: No bruits, no goiter.  Heart: Regular rate and rhythm. Lungs: Good air movement, clear Abdomen: Soft, nontender, nondistended, positive bowel sounds.  Neuro: Grossly intact, nonfocal.   Data Reviewed: Basic Metabolic Panel:  Recent Labs Lab 04/25/15 1430 04/25/15 1808 04/25/15 2232 04/26/15 0248 04/26/15 0910  NA 134* 135 135 135 135  K 3.6 3.8 3.5 3.3* 3.5  CL 110 107 109 109 108  CO2 13* 17* 16* 15* 17*  GLUCOSE 189* 148* 128* 176* 151*  BUN 7 6 6  5* <5*  CREATININE 1.06* 0.98 0.97 0.91 0.83  CALCIUM 8.4* 8.7* 8.7* 8.9 8.9   Liver Function  Tests:  Recent Labs Lab 04/25/15 0258  AST 26  ALT 35  ALKPHOS 96  BILITOT 1.0  PROT 7.2  ALBUMIN 3.8   No results for input(s): LIPASE, AMYLASE in the last 168 hours. No results for input(s): AMMONIA in the last 168 hours. CBC:  Recent Labs Lab 04/25/15 0258  WBC 6.5  NEUTROABS 3.5  HGB 15.1*  HCT 42.4  MCV 86.0  PLT 193   Cardiac Enzymes:  Recent Labs Lab 04/25/15 0258 04/25/15 0800  TROPONINI <0.03 <0.03   BNP (last 3 results) No results for input(s): BNP in the last 8760 hours.  ProBNP (last 3 results) No results for input(s): PROBNP in the last 8760 hours.  CBG:  Recent Labs Lab 04/26/15 0636 04/26/15 0800 04/26/15 0901 04/26/15 1014 04/26/15 1111  GLUCAP 182* 137* 143* 143* 153*    No results found for this or any previous visit (from the past 240 hour(s)).   Studies: Ct Head Wo Contrast  04/25/2015   CLINICAL DATA:  Subacute onset of dizziness for several weeks. Initial encounter.  EXAM: CT HEAD WITHOUT CONTRAST  TECHNIQUE: Contiguous axial images were obtained from the base of the skull through the vertex without intravenous contrast.  COMPARISON:  None.  FINDINGS: There is no evidence of acute infarction, mass lesion, or intra- or extra-axial hemorrhage on CT.  Mild periventricular  white matter change may reflect small vessel ischemic microangiopathy.  The posterior fossa, including the cerebellum, brainstem and fourth ventricle, is within normal limits. The third and lateral ventricles, and basal ganglia are unremarkable in appearance. The cerebral hemispheres are symmetric in appearance, with normal gray-white differentiation. No mass effect or midline shift is seen.  There is no evidence of fracture; visualized osseous structures are unremarkable in appearance. The orbits are within normal limits. The paranasal sinuses and mastoid air cells are well-aerated. No significant soft tissue abnormalities are seen.  IMPRESSION: 1. No acute intracranial  pathology seen on CT. 2. Suggestion of mild small vessel ischemic microangiopathy.   Electronically Signed   By: Roanna Raider M.D.   On: 04/25/2015 02:26   Dg Chest Port 1 View  04/25/2015   CLINICAL DATA:  49 year old female with hyperglycemia, chest pain. Initial encounter.  EXAM: PORTABLE CHEST - 1 VIEW  COMPARISON:  None.  FINDINGS: Portable AP upright view at 0632 hours. Low lung volumes. The lungs remain clear. Normal cardiac size and mediastinal contours. No pneumothorax or pleural effusion.  IMPRESSION: Low lung volumes, otherwise no acute cardiopulmonary abnormality.   Electronically Signed   By: Odessa Fleming M.D.   On: 04/25/2015 08:04    Scheduled Meds:   Continuous Infusions: . dextrose 75 mL/hr at 04/25/15 2021  . insulin (NOVOLIN-R) infusion 2.5 Units/hr (04/26/15 1026)    Time Spent: 25 min   FELIZ Rosine Beat  [facility] Pager [phone]. If 7PM-7AM, please contact night-coverage at [email or web address], password [facility] 04/26/2015, 11:36 AM  LOS: 1 day

## 2015-04-27 DIAGNOSIS — E0810 Diabetes mellitus due to underlying condition with ketoacidosis without coma: Secondary | ICD-10-CM

## 2015-04-27 LAB — BASIC METABOLIC PANEL
ANION GAP: 10 (ref 5–15)
ANION GAP: 13 (ref 5–15)
ANION GAP: 11 (ref 5–15)
ANION GAP: 10 (ref 5–15)
ANION GAP: 12 (ref 5–15)
ANION GAP: 9 (ref 5–15)
BUN: <5 mg/dL — ABNORMAL LOW (ref 6–20)
BUN: <5 mg/dL — ABNORMAL LOW (ref 6–20)
BUN: <5 mg/dL — ABNORMAL LOW (ref 6–20)
BUN: <5 mg/dL — ABNORMAL LOW (ref 6–20)
BUN: <5 mg/dL — ABNORMAL LOW (ref 6–20)
CALCIUM: 8.5 mg/dL — AB (ref 8.9–10.3)
CALCIUM: 8.6 mg/dL — AB (ref 8.9–10.3)
CALCIUM: 8.5 mg/dL — AB (ref 8.9–10.3)
CALCIUM: 8.5 mg/dL — AB (ref 8.9–10.3)
CALCIUM: 9 mg/dL (ref 8.9–10.3)
CHLORIDE: 106 mmol/L (ref 101–111)
CO2: 17 mmol/L — AB (ref 22–32)
CO2: 16 mmol/L — ABNORMAL LOW (ref 22–32)
CO2: 18 mmol/L — ABNORMAL LOW (ref 22–32)
CO2: 18 mmol/L — ABNORMAL LOW (ref 22–32)
CO2: 19 mmol/L — ABNORMAL LOW (ref 22–32)
CO2: 23 mmol/L (ref 22–32)
CREATININE: 1.02 mg/dL — AB (ref 0.44–1.00)
Calcium: 8.3 mg/dL — ABNORMAL LOW (ref 8.9–10.3)
Chloride: 108 mmol/L (ref 101–111)
Chloride: 105 mmol/L (ref 101–111)
Chloride: 106 mmol/L (ref 101–111)
Chloride: 104 mmol/L (ref 101–111)
Chloride: 104 mmol/L (ref 101–111)
Creatinine, Ser: 0.91 mg/dL (ref 0.44–1.00)
Creatinine, Ser: 0.94 mg/dL (ref 0.44–1.00)
Creatinine, Ser: 0.85 mg/dL (ref 0.44–1.00)
Creatinine, Ser: 0.83 mg/dL (ref 0.44–1.00)
Creatinine, Ser: 0.9 mg/dL (ref 0.44–1.00)
GFR calc Af Amer: >60 mL/min (ref >60)
GFR calc Af Amer: >60 mL/min (ref >60)
GFR calc Af Amer: >60 mL/min (ref >60)
GFR calc non Af Amer: >60 mL/min (ref >60)
GLUCOSE: 267 mg/dL — AB (ref 65–99)
GLUCOSE: 265 mg/dL — AB (ref 65–99)
GLUCOSE: 315 mg/dL — AB (ref 65–99)
GLUCOSE: 299 mg/dL — AB (ref 65–99)
GLUCOSE: 278 mg/dL — AB (ref 65–99)
Glucose, Bld: 329 mg/dL — ABNORMAL HIGH (ref 65–99)
POTASSIUM: 3.6 mmol/L (ref 3.5–5.1)
Potassium: 3.8 mmol/L (ref 3.5–5.1)
Potassium: 3.8 mmol/L (ref 3.5–5.1)
Potassium: 3.5 mmol/L (ref 3.5–5.1)
Potassium: 3.3 mmol/L — ABNORMAL LOW (ref 3.5–5.1)
Potassium: 3.3 mmol/L — ABNORMAL LOW (ref 3.5–5.1)
SODIUM: 134 mmol/L — AB (ref 135–145)
SODIUM: 134 mmol/L — AB (ref 135–145)
Sodium: 135 mmol/L (ref 135–145)
Sodium: 135 mmol/L (ref 135–145)
Sodium: 135 mmol/L (ref 135–145)
Sodium: 136 mmol/L (ref 135–145)

## 2015-04-27 LAB — GLUCOSE, CAPILLARY
GLUCOSE-CAPILLARY: 229 mg/dL — AB (ref 65–99)
Glucose-Capillary: 273 mg/dL — ABNORMAL HIGH (ref 65–99)
Glucose-Capillary: 259 mg/dL — ABNORMAL HIGH (ref 65–99)
Glucose-Capillary: 264 mg/dL — ABNORMAL HIGH (ref 65–99)
Glucose-Capillary: 255 mg/dL — ABNORMAL HIGH (ref 65–99)
Glucose-Capillary: 317 mg/dL — ABNORMAL HIGH (ref 65–99)
Glucose-Capillary: 305 mg/dL — ABNORMAL HIGH (ref 65–99)
Glucose-Capillary: 323 mg/dL — ABNORMAL HIGH (ref 65–99)
Glucose-Capillary: 261 mg/dL — ABNORMAL HIGH (ref 65–99)
Glucose-Capillary: 303 mg/dL — ABNORMAL HIGH (ref 65–99)
Glucose-Capillary: 268 mg/dL — ABNORMAL HIGH (ref 65–99)

## 2015-04-27 LAB — CHLORIDE, URINE, RANDOM
CHLORIDE URINE: 64 mmol/L
CHLORIDE URINE: 52 mmol/L

## 2015-04-27 LAB — SODIUM, URINE, RANDOM

## 2015-04-27 LAB — NA AND K (SODIUM & POTASSIUM), RAND UR
POTASSIUM UR: 17 mmol/L
Sodium, Ur: <10 mmol/L

## 2015-04-27 LAB — POTASSIUM, URINE RANDOM: POTASSIUM UR: 14 mmol/L

## 2015-04-27 LAB — GLUTAMIC ACID DECARBOXYLASE AUTO ABS

## 2015-04-27 MED ORDER — DEXTROSE-NACL 5-0.45 % IV SOLN
INTRAVENOUS | Status: DC
  Administered 2015-04-27 (×2): via INTRAVENOUS

## 2015-04-27 MED ORDER — SODIUM BICARBONATE 650 MG PO TABS
650.0000 mg | ORAL_TABLET | Freq: Two times a day (BID) | ORAL | Status: DC
  Administered 2015-04-27 – 2015-04-28 (×3): 650 mg via ORAL
  Filled 2015-04-27 (×3): qty 1

## 2015-04-27 NOTE — Consult Note (Signed)
HPI: I was asked to see Cathy Waters who is a 48 y.o. female with hx of GERD, no healthcare contact or other known PMH, who presented to the hospital on 8/17 with dizziness and blurred vision for few days, with polyuria, polydipsia, and n+V. Found to have elevated BS and anion gap. Was admitted for DKA. hgba1c 11.7. No hx of DM II that patient was aware of. Was placed on insulin drip with fluids. Dizziness was thought to be 2/2 to orthostatic hypotension. Doing better now. Denies any complaints currently. Denies any known hx of diabetes, has FH of diabetes (uncles - not sure what type). Denies any recent infection, fever/chills, diarrhea.    Past Medical History  Diagnosis Date  . Acid reflux   . Medical history non-contributory    Past Surgical History  Procedure Laterality Date  . Tonsillectomy     Social History:  reports that she has never smoked. She does not have any smokeless tobacco history on file. She reports that she does not drink alcohol or use illicit drugs. Allergies: No Known Allergies Family History  Problem Relation Age of Onset  . Diabetes Mellitus II Maternal Uncle     Medications: I have reviewed the patient's current medications.   ROS:   Review of Systems  Constitutional: Negative for fever and chills.  HENT: Negative for sore throat.   Eyes: Negative for blurred vision and double vision.  Respiratory: Negative for cough, hemoptysis and shortness of breath.   Cardiovascular: Negative for chest pain, palpitations and leg swelling.  Gastrointestinal: Negative for nausea, vomiting and abdominal pain.  Genitourinary: Negative for dysuria.  Skin: Negative.   Neurological: Negative.  Negative for weakness and headaches.  Endo/Heme/Allergies: Negative.   Psychiatric/Behavioral: Negative.     Blood pressure 118/65, pulse 83, temperature 98.1 F (36.7 C), temperature source Oral, resp. rate 20, height 5' 9"  (1.753 m), weight 247 lb (112.038 kg), last  menstrual period 04/10/2015, SpO2 98 %.  Physical Exam  Constitutional: She is oriented to person, place, and time.  Pleasant female. NAD  HENT:  Head: Normocephalic and atraumatic.  Mouth/Throat: Oropharynx is clear and moist.  Eyes: Pupils are equal, round, and reactive to light. No scleral icterus.  Neck: Normal range of motion.  Cardiovascular: Normal rate and regular rhythm.  Exam reveals no gallop and no friction rub.   No murmur heard. Respiratory: Effort normal and breath sounds normal. No respiratory distress.  GI: Soft. Bowel sounds are normal. There is no tenderness.  Musculoskeletal: Normal range of motion. She exhibits no edema or tenderness.  Neurological: She is alert and oriented to person, place, and time. No cranial nerve deficit.    Results for orders placed or performed during the hospital encounter of 04/24/15 (from the past 48 hour(s))  Basic metabolic panel (stat then every 4 hours)     Status: Abnormal   Collection Time: 04/25/15  2:30 PM  Result Value Ref Range   Sodium 134 (L) 135 - 145 mmol/L   Potassium 3.6 3.5 - 5.1 mmol/L   Chloride 110 101 - 111 mmol/L   CO2 13 (L) 22 - 32 mmol/L   Glucose, Bld 189 (H) 65 - 99 mg/dL   BUN 7 6 - 20 mg/dL   Creatinine, Ser 1.06 (H) 0.44 - 1.00 mg/dL   Calcium 8.4 (L) 8.9 - 10.3 mg/dL   GFR calc non Af Amer >60 >60 mL/min   GFR calc Af Amer >60 >60 mL/min    Comment: (NOTE)  The eGFR has been calculated using the CKD EPI equation. This calculation has not been validated in all clinical situations. eGFR's persistently <60 mL/min signify possible Chronic Kidney Disease.    Anion gap 11 5 - 15  Glucose, capillary     Status: Abnormal   Collection Time: 04/25/15  2:36 PM  Result Value Ref Range   Glucose-Capillary 181 (H) 65 - 99 mg/dL   Comment 1 Notify RN   Glucose, capillary     Status: Abnormal   Collection Time: 04/25/15  3:47 PM  Result Value Ref Range   Glucose-Capillary 147 (H) 65 - 99 mg/dL  Glucose,  capillary     Status: Abnormal   Collection Time: 04/25/15  4:39 PM  Result Value Ref Range   Glucose-Capillary 138 (H) 65 - 99 mg/dL  Basic metabolic panel (stat then every 4 hours)     Status: Abnormal   Collection Time: 04/25/15  6:08 PM  Result Value Ref Range   Sodium 135 135 - 145 mmol/L   Potassium 3.8 3.5 - 5.1 mmol/L   Chloride 107 101 - 111 mmol/L   CO2 17 (L) 22 - 32 mmol/L   Glucose, Bld 148 (H) 65 - 99 mg/dL   BUN 6 6 - 20 mg/dL   Creatinine, Ser 0.98 0.44 - 1.00 mg/dL   Calcium 8.7 (L) 8.9 - 10.3 mg/dL   GFR calc non Af Amer >60 >60 mL/min   GFR calc Af Amer >60 >60 mL/min    Comment: (NOTE) The eGFR has been calculated using the CKD EPI equation. This calculation has not been validated in all clinical situations. eGFR's persistently <60 mL/min signify possible Chronic Kidney Disease.    Anion gap 11 5 - 15  Glucose, capillary     Status: Abnormal   Collection Time: 04/25/15  6:08 PM  Result Value Ref Range   Glucose-Capillary 139 (H) 65 - 99 mg/dL  Glucose, capillary     Status: Abnormal   Collection Time: 04/25/15  7:09 PM  Result Value Ref Range   Glucose-Capillary 161 (H) 65 - 99 mg/dL  Glucose, capillary     Status: Abnormal   Collection Time: 04/25/15  8:15 PM  Result Value Ref Range   Glucose-Capillary 172 (H) 65 - 99 mg/dL  Glucose, capillary     Status: Abnormal   Collection Time: 04/25/15  9:19 PM  Result Value Ref Range   Glucose-Capillary 197 (H) 65 - 99 mg/dL  Basic metabolic panel (stat then every 4 hours)     Status: Abnormal   Collection Time: 04/25/15 10:32 PM  Result Value Ref Range   Sodium 135 135 - 145 mmol/L   Potassium 3.5 3.5 - 5.1 mmol/L   Chloride 109 101 - 111 mmol/L   CO2 16 (L) 22 - 32 mmol/L   Glucose, Bld 128 (H) 65 - 99 mg/dL   BUN 6 6 - 20 mg/dL   Creatinine, Ser 0.97 0.44 - 1.00 mg/dL   Calcium 8.7 (L) 8.9 - 10.3 mg/dL   GFR calc non Af Amer >60 >60 mL/min   GFR calc Af Amer >60 >60 mL/min    Comment: (NOTE) The  eGFR has been calculated using the CKD EPI equation. This calculation has not been validated in all clinical situations. eGFR's persistently <60 mL/min signify possible Chronic Kidney Disease.    Anion gap 10 5 - 15  Glucose, capillary     Status: Abnormal   Collection Time: 04/25/15 10:35 PM  Result Value  Ref Range   Glucose-Capillary 120 (H) 65 - 99 mg/dL  Glucose, capillary     Status: Abnormal   Collection Time: 04/26/15 12:07 AM  Result Value Ref Range   Glucose-Capillary 131 (H) 65 - 99 mg/dL  Glucose, capillary     Status: Abnormal   Collection Time: 04/26/15  1:11 AM  Result Value Ref Range   Glucose-Capillary 146 (H) 65 - 99 mg/dL  Glucose, capillary     Status: Abnormal   Collection Time: 04/26/15  2:15 AM  Result Value Ref Range   Glucose-Capillary 177 (H) 65 - 99 mg/dL  Basic metabolic panel (stat then every 4 hours)     Status: Abnormal   Collection Time: 04/26/15  2:48 AM  Result Value Ref Range   Sodium 135 135 - 145 mmol/L   Potassium 3.3 (L) 3.5 - 5.1 mmol/L   Chloride 109 101 - 111 mmol/L   CO2 15 (L) 22 - 32 mmol/L   Glucose, Bld 176 (H) 65 - 99 mg/dL   BUN 5 (L) 6 - 20 mg/dL   Creatinine, Ser 0.91 0.44 - 1.00 mg/dL   Calcium 8.9 8.9 - 10.3 mg/dL   GFR calc non Af Amer >60 >60 mL/min   GFR calc Af Amer >60 >60 mL/min    Comment: (NOTE) The eGFR has been calculated using the CKD EPI equation. This calculation has not been validated in all clinical situations. eGFR's persistently <60 mL/min signify possible Chronic Kidney Disease.    Anion gap 11 5 - 15  Glucose, capillary     Status: Abnormal   Collection Time: 04/26/15  3:21 AM  Result Value Ref Range   Glucose-Capillary 180 (H) 65 - 99 mg/dL  Glucose, capillary     Status: Abnormal   Collection Time: 04/26/15  4:28 AM  Result Value Ref Range   Glucose-Capillary 157 (H) 65 - 99 mg/dL  Glucose, capillary     Status: Abnormal   Collection Time: 04/26/15  5:32 AM  Result Value Ref Range    Glucose-Capillary 190 (H) 65 - 99 mg/dL  Glucose, capillary     Status: Abnormal   Collection Time: 04/26/15  6:36 AM  Result Value Ref Range   Glucose-Capillary 182 (H) 65 - 99 mg/dL  Glucose, capillary     Status: Abnormal   Collection Time: 04/26/15  8:00 AM  Result Value Ref Range   Glucose-Capillary 137 (H) 65 - 99 mg/dL  Glucose, capillary     Status: Abnormal   Collection Time: 04/26/15  9:01 AM  Result Value Ref Range   Glucose-Capillary 143 (H) 65 - 99 mg/dL  Basic metabolic panel (stat then every 4 hours)     Status: Abnormal   Collection Time: 04/26/15  9:10 AM  Result Value Ref Range   Sodium 135 135 - 145 mmol/L   Potassium 3.5 3.5 - 5.1 mmol/L   Chloride 108 101 - 111 mmol/L   CO2 17 (L) 22 - 32 mmol/L   Glucose, Bld 151 (H) 65 - 99 mg/dL   BUN <5 (L) 6 - 20 mg/dL   Creatinine, Ser 0.83 0.44 - 1.00 mg/dL   Calcium 8.9 8.9 - 10.3 mg/dL   GFR calc non Af Amer >60 >60 mL/min   GFR calc Af Amer >60 >60 mL/min    Comment: (NOTE) The eGFR has been calculated using the CKD EPI equation. This calculation has not been validated in all clinical situations. eGFR's persistently <60 mL/min signify possible Chronic Kidney Disease.  Anion gap 10 5 - 15  Glucose, capillary     Status: Abnormal   Collection Time: 04/26/15 10:14 AM  Result Value Ref Range   Glucose-Capillary 143 (H) 65 - 99 mg/dL  Glucose, capillary     Status: Abnormal   Collection Time: 04/26/15 11:11 AM  Result Value Ref Range   Glucose-Capillary 153 (H) 65 - 99 mg/dL  Glucose, capillary     Status: Abnormal   Collection Time: 04/26/15  1:07 PM  Result Value Ref Range   Glucose-Capillary 203 (H) 65 - 99 mg/dL  Basic metabolic panel (stat then every 4 hours)     Status: Abnormal   Collection Time: 04/26/15  2:18 PM  Result Value Ref Range   Sodium 133 (L) 135 - 145 mmol/L   Potassium 3.7 3.5 - 5.1 mmol/L   Chloride 105 101 - 111 mmol/L   CO2 16 (L) 22 - 32 mmol/L   Glucose, Bld 269 (H) 65 - 99  mg/dL   BUN <5 (L) 6 - 20 mg/dL   Creatinine, Ser 0.96 0.44 - 1.00 mg/dL   Calcium 8.8 (L) 8.9 - 10.3 mg/dL   GFR calc non Af Amer >60 >60 mL/min   GFR calc Af Amer >60 >60 mL/min    Comment: (NOTE) The eGFR has been calculated using the CKD EPI equation. This calculation has not been validated in all clinical situations. eGFR's persistently <60 mL/min signify possible Chronic Kidney Disease.    Anion gap 12 5 - 15  Glucose, capillary     Status: Abnormal   Collection Time: 04/26/15  3:22 PM  Result Value Ref Range   Glucose-Capillary 272 (H) 65 - 99 mg/dL  Glucose, capillary     Status: Abnormal   Collection Time: 04/26/15  5:16 PM  Result Value Ref Range   Glucose-Capillary 239 (H) 65 - 99 mg/dL  Basic metabolic panel (stat then every 4 hours)     Status: Abnormal   Collection Time: 04/26/15  5:51 PM  Result Value Ref Range   Sodium 132 (L) 135 - 145 mmol/L   Potassium 3.5 3.5 - 5.1 mmol/L   Chloride 104 101 - 111 mmol/L   CO2 15 (L) 22 - 32 mmol/L   Glucose, Bld 271 (H) 65 - 99 mg/dL   BUN <5 (L) 6 - 20 mg/dL   Creatinine, Ser 1.09 (H) 0.44 - 1.00 mg/dL   Calcium 8.7 (L) 8.9 - 10.3 mg/dL   GFR calc non Af Amer 59 (L) >60 mL/min   GFR calc Af Amer >60 >60 mL/min    Comment: (NOTE) The eGFR has been calculated using the CKD EPI equation. This calculation has not been validated in all clinical situations. eGFR's persistently <60 mL/min signify possible Chronic Kidney Disease.    Anion gap 13 5 - 15  Glucose, capillary     Status: Abnormal   Collection Time: 04/26/15  8:02 PM  Result Value Ref Range   Glucose-Capillary 308 (H) 65 - 99 mg/dL  Basic metabolic panel (stat then every 4 hours)     Status: Abnormal   Collection Time: 04/26/15  9:40 PM  Result Value Ref Range   Sodium 134 (L) 135 - 145 mmol/L   Potassium 3.9 3.5 - 5.1 mmol/L   Chloride 104 101 - 111 mmol/L   CO2 16 (L) 22 - 32 mmol/L   Glucose, Bld 319 (H) 65 - 99 mg/dL   BUN 6 6 - 20 mg/dL   Creatinine,  Ser  1.03 (H) 0.44 - 1.00 mg/dL   Calcium 8.7 (L) 8.9 - 10.3 mg/dL   GFR calc non Af Amer >60 >60 mL/min   GFR calc Af Amer >60 >60 mL/min    Comment: (NOTE) The eGFR has been calculated using the CKD EPI equation. This calculation has not been validated in all clinical situations. eGFR's persistently <60 mL/min signify possible Chronic Kidney Disease.    Anion gap 14 5 - 15  Glucose, capillary     Status: Abnormal   Collection Time: 04/26/15  9:54 PM  Result Value Ref Range   Glucose-Capillary 273 (H) 65 - 99 mg/dL   Comment 1 Notify RN   Glucose, capillary     Status: Abnormal   Collection Time: 04/27/15  2:03 AM  Result Value Ref Range   Glucose-Capillary 259 (H) 65 - 99 mg/dL  Basic metabolic panel (stat then every 4 hours)     Status: Abnormal   Collection Time: 04/27/15  2:38 AM  Result Value Ref Range   Sodium 135 135 - 145 mmol/L   Potassium 3.8 3.5 - 5.1 mmol/L   Chloride 108 101 - 111 mmol/L   CO2 17 (L) 22 - 32 mmol/L   Glucose, Bld 267 (H) 65 - 99 mg/dL   BUN <5 (L) 6 - 20 mg/dL   Creatinine, Ser 1.02 (H) 0.44 - 1.00 mg/dL   Calcium 8.5 (L) 8.9 - 10.3 mg/dL   GFR calc non Af Amer >60 >60 mL/min   GFR calc Af Amer >60 >60 mL/min    Comment: (NOTE) The eGFR has been calculated using the CKD EPI equation. This calculation has not been validated in all clinical situations. eGFR's persistently <60 mL/min signify possible Chronic Kidney Disease.    Anion gap 10 5 - 15  Glucose, capillary     Status: Abnormal   Collection Time: 04/27/15  4:05 AM  Result Value Ref Range   Glucose-Capillary 264 (H) 65 - 99 mg/dL   Comment 1 Notify RN   Na and K (sodium & potassium), rand urine     Status: None   Collection Time: 04/27/15  4:58 AM  Result Value Ref Range   Sodium, Ur <10 mmol/L   Potassium Urine Timed 17 mmol/L  Chloride, urine, random     Status: None   Collection Time: 04/27/15  4:58 AM  Result Value Ref Range   Chloride Urine 64 mmol/L  Glucose, capillary      Status: Abnormal   Collection Time: 04/27/15  5:56 AM  Result Value Ref Range   Glucose-Capillary 255 (H) 65 - 99 mg/dL   Comment 1 Notify RN   Basic metabolic panel (stat then every 4 hours)     Status: Abnormal   Collection Time: 04/27/15  6:09 AM  Result Value Ref Range   Sodium 135 135 - 145 mmol/L   Potassium 3.6 3.5 - 5.1 mmol/L   Chloride 106 101 - 111 mmol/L   CO2 16 (L) 22 - 32 mmol/L   Glucose, Bld 265 (H) 65 - 99 mg/dL   BUN <5 (L) 6 - 20 mg/dL   Creatinine, Ser 0.91 0.44 - 1.00 mg/dL   Calcium 8.3 (L) 8.9 - 10.3 mg/dL   GFR calc non Af Amer >60 >60 mL/min   GFR calc Af Amer >60 >60 mL/min    Comment: (NOTE) The eGFR has been calculated using the CKD EPI equation. This calculation has not been validated in all clinical situations. eGFR's persistently <60 mL/min  signify possible Chronic Kidney Disease.    Anion gap 13 5 - 15  Glucose, capillary     Status: Abnormal   Collection Time: 04/27/15  7:39 AM  Result Value Ref Range   Glucose-Capillary 229 (H) 65 - 99 mg/dL  Glucose, capillary     Status: Abnormal   Collection Time: 04/27/15 10:17 AM  Result Value Ref Range   Glucose-Capillary 317 (H) 65 - 99 mg/dL  Basic metabolic panel (stat then every 4 hours)     Status: Abnormal   Collection Time: 04/27/15 10:40 AM  Result Value Ref Range   Sodium 134 (L) 135 - 145 mmol/L   Potassium 3.8 3.5 - 5.1 mmol/L   Chloride 105 101 - 111 mmol/L   CO2 18 (L) 22 - 32 mmol/L   Glucose, Bld 329 (H) 65 - 99 mg/dL   BUN <5 (L) 6 - 20 mg/dL   Creatinine, Ser 0.94 0.44 - 1.00 mg/dL   Calcium 8.6 (L) 8.9 - 10.3 mg/dL   GFR calc non Af Amer >60 >60 mL/min   GFR calc Af Amer >60 >60 mL/min    Comment: (NOTE) The eGFR has been calculated using the CKD EPI equation. This calculation has not been validated in all clinical situations. eGFR's persistently <60 mL/min signify possible Chronic Kidney Disease.    Anion gap 11 5 - 15  Sodium, urine, random     Status: None   Collection  Time: 04/27/15 10:40 AM  Result Value Ref Range   Sodium, Ur <10 mmol/L  Chloride, urine, random     Status: None   Collection Time: 04/27/15 10:40 AM  Result Value Ref Range   Chloride Urine 52 mmol/L  Potassium, Urine Random     Status: None   Collection Time: 04/27/15 10:40 AM  Result Value Ref Range   Potassium Urine Timed 14 mmol/L  Glucose, capillary     Status: Abnormal   Collection Time: 04/27/15 12:38 PM  Result Value Ref Range   Glucose-Capillary 305 (H) 65 - 99 mg/dL   No results found.  Assessment:  48 yo female with no known PMH other than GERD here with high BS found to be have DKA.  1. DKA - initially had gap met acidosis with gap 18, bicarb 12, ketouria of >80, glucose was highest in 300's. Was on insulin gtt. Gap closed, bicarb now is 18.  2. Gap met acidosis >> non gap acidosis after insulin gtt - this is likely 2/2 to DKA and base deficit, also likely contributed by plasma expansion acidosis.  Doubt this is RTA. Improving with insulin and ketones are present in the urine.  urine anion gap ~ -37, which goes against RTA.  3. Mild AKI - likely 2/2 to dehydration from DKA. Resolved with fluids. 4. DM - could be type I with DKA or long standing type II - will need to be on insulin long term.  Plan: 1. Continue to treat with insulin, acidosis should continue to improve with that as it already has. You may consider repeating UA to see if ketones are still present.  Agree with PO bicarb temporarily to replete base storage. Recommend no further workup.  2. May benefit from turning off D5W as her BS is running 300's on insulin drip.  Will sign off, call us if needed.   Ahmed, Chesley Mires 04/27/2015, 2:09 PM   Renal Attending: I agree with the note above as articulated.  Pt history was reviewed and patient examined by me.  Izeah Vossler C

## 2015-04-27 NOTE — Progress Notes (Signed)
   [facility] PROGRESS NOTE Assessment/Plan: DKA, type 2/high anion gap acidosis: - Continue insulin drip, still in DKA bicarbonate is only 16-17. Labs were drawn when she was significantly dehydrated and may be altered, Recheck urinary sodium, chloride has been withdrawn when she was dehydrated. - Continue B-mets every 4 CBG every hourly.  Orthostatic hypotension: - Resolved.   ARF (acute renal failure): - Likely prerenal, resolved with IV hydration.  Nausea and vomiting: Resolved, likely due to DKA.  Hyponatremia: Likely due to hypovolemia continue IV hydration.  Code Status: full Family Communication: none  Disposition Plan: inpatient   Consultants:  none  Procedures:  CXR  Antibiotics:  None  HPI/Subjective: No complains  Objective: Filed Vitals:   04/26/15 0547 04/26/15 1427 04/26/15 2005 04/27/15 0601  BP: 123/74 126/54 130/68 118/65  Pulse: 88 95 87 83  Temp: 98.1 F (36.7 C) 97.7 F (36.5 C) 98.1 F (36.7 C) 98.1 F (36.7 C)  TempSrc: Oral Oral Oral Oral  Resp: Height:      Weight:      SpO2: 100% 97% 100% 98%   No intake or output data in the 24 hours ending 04/27/15 1010 Filed Weights   04/24/15 2115  Weight: 112.038 kg (247 lb)    Exam:  General: Alert, awake, oriented x3, in no acute distress.  HEENT: No bruits, no goiter.  Heart: Regular rate and rhythm. Lungs: Good air movement, clear Abdomen: Soft, nontender, nondistended, positive bowel sounds.  Neuro: Grossly intact, nonfocal.   Data Reviewed: Basic Metabolic Panel:  Recent Labs Lab 04/26/15 1418 04/26/15 1751 04/26/15 2140 04/27/15 0238 04/27/15 0609  NA 133* 132* 134* 135 135  K 3.7 3.5 3.9 3.8 3.6  CL 105 104 104 108 106  CO2 16* 15* 16* 17* 16*  GLUCOSE 269* 271* 319* 267* 265*  BUN <5* <5* 6 <5* <5*  CREATININE 0.96 1.09* 1.03* 1.02* 0.91  CALCIUM 8.8* 8.7* 8.7* 8.5* 8.3*   Liver Function Tests:  Recent Labs Lab 04/25/15 0258    AST 26  ALT 35  ALKPHOS 96  BILITOT 1.0  PROT 7.2  ALBUMIN 3.8   No results for input(s): LIPASE, AMYLASE in the last 168 hours. No results for input(s): AMMONIA in the last 168 hours. CBC:  Recent Labs Lab 04/25/15 0258  WBC 6.5  NEUTROABS 3.5  HGB 15.1*  HCT 42.4  MCV 86.0  PLT 193   Cardiac Enzymes:  Recent Labs Lab 04/25/15 0258 04/25/15 0800  TROPONINI <0.03 <0.03   BNP (last 3 results) No results for input(s): BNP in the last 8760 hours.  ProBNP (last 3 results) No results for input(s): PROBNP in the last 8760 hours.  CBG:  Recent Labs Lab 04/26/15 2154 04/27/15 0203 04/27/15 0405 04/27/15 0556 04/27/15 0739  GLUCAP 273* 259* 264* 255* 229*    No results found for this or any previous visit (from the past 240 hour(s)).   Studies: No results found.  Scheduled Meds:   Continuous Infusions: . dextrose 50 mL/hr at 04/27/15 0453  . dextrose 5 % and 0.45% NaCl 100 mL/hr at 04/27/15 0850  . insulin (NOVOLIN-R) infusion 0.7 Units/hr (04/26/15 1236)    Time Spent: 25 min   FELIZ Rosine Beat  [facility] Pager [phone]. If 7PM-7AM, please contact night-coverage at [email or web address], password [facility] 04/27/2015, 10:10 AM  LOS: 2 days

## 2015-04-28 ENCOUNTER — Telehealth: Payer: Self-pay | Admitting: *Deleted

## 2015-04-28 LAB — BASIC METABOLIC PANEL
ANION GAP: 11 (ref 5–15)
Anion gap: 11 (ref 5–15)
BUN: 5 mg/dL — ABNORMAL LOW (ref 6–20)
CHLORIDE: 103 mmol/L (ref 101–111)
CO2: 21 mmol/L — AB (ref 22–32)
CO2: 22 mmol/L (ref 22–32)
CREATININE: 0.82 mg/dL (ref 0.44–1.00)
Calcium: 8.6 mg/dL — ABNORMAL LOW (ref 8.9–10.3)
Calcium: 8.4 mg/dL — ABNORMAL LOW (ref 8.9–10.3)
Chloride: 103 mmol/L (ref 101–111)
Creatinine, Ser: 0.87 mg/dL (ref 0.44–1.00)
GFR calc Af Amer: >60 mL/min (ref >60)
GFR calc Af Amer: >60 mL/min (ref >60)
GFR calc non Af Amer: >60 mL/min (ref >60)
GFR calc non Af Amer: >60 mL/min (ref >60)
GLUCOSE: 252 mg/dL — AB (ref 65–99)
GLUCOSE: 261 mg/dL — AB (ref 65–99)
POTASSIUM: 3.3 mmol/L — AB (ref 3.5–5.1)
Potassium: 3.2 mmol/L — ABNORMAL LOW (ref 3.5–5.1)
SODIUM: 135 mmol/L (ref 135–145)
Sodium: 136 mmol/L (ref 135–145)

## 2015-04-28 LAB — GLUCOSE, CAPILLARY
GLUCOSE-CAPILLARY: 238 mg/dL — AB (ref 65–99)
GLUCOSE-CAPILLARY: 241 mg/dL — AB (ref 65–99)
GLUCOSE-CAPILLARY: 244 mg/dL — AB (ref 65–99)
GLUCOSE-CAPILLARY: 253 mg/dL — AB (ref 65–99)
GLUCOSE-CAPILLARY: 261 mg/dL — AB (ref 65–99)
Glucose-Capillary: 206 mg/dL — ABNORMAL HIGH (ref 65–99)
Glucose-Capillary: 282 mg/dL — ABNORMAL HIGH (ref 65–99)

## 2015-04-28 MED ORDER — INSULIN ASPART 100 UNIT/ML ~~LOC~~ SOLN
4.0000 [IU] | Freq: Three times a day (TID) | SUBCUTANEOUS | Status: DC
  Administered 2015-04-28: 4 [IU] via SUBCUTANEOUS

## 2015-04-28 MED ORDER — "INSULIN SYRINGE-NEEDLE U-100 29G X 1/2"" 2 ML MISC": 30.0000 | Freq: Four times a day (QID) | Status: AC

## 2015-04-28 MED ORDER — INSULIN ASPART 100 UNIT/ML ~~LOC~~ SOLN
0.0000 [IU] | Freq: Three times a day (TID) | SUBCUTANEOUS | Status: DC
  Administered 2015-04-28: 8 [IU] via SUBCUTANEOUS

## 2015-04-28 MED ORDER — INSULIN PEN NEEDLE 31G X 6 MM MISC: 1.0000 | Freq: Two times a day (BID) | Status: DC

## 2015-04-28 MED ORDER — INSULIN ASPART 100 UNIT/ML FLEXPEN: 3.0000 [IU] | PEN_INJECTOR | Freq: Three times a day (TID) | SUBCUTANEOUS | Status: DC

## 2015-04-28 MED ORDER — INSULIN ASPART 100 UNIT/ML ~~LOC~~ SOLN: 0.0000 [IU] | Freq: Every day | SUBCUTANEOUS | Status: DC

## 2015-04-28 MED ORDER — INSULIN DETEMIR 100 UNIT/ML ~~LOC~~ SOLN
10.0000 [IU] | Freq: Once | SUBCUTANEOUS | Status: DC
  Filled 2015-04-28: qty 0.1

## 2015-04-28 MED ORDER — INSULIN DETEMIR 100 UNIT/ML ~~LOC~~ SOLN: 35.0000 [IU] | Freq: Every day | SUBCUTANEOUS | Status: DC

## 2015-04-28 MED ORDER — INSULIN DETEMIR 100 UNIT/ML ~~LOC~~ SOLN
20.0000 [IU] | Freq: Every day | SUBCUTANEOUS | Status: DC
  Administered 2015-04-28: 20 [IU] via SUBCUTANEOUS
  Filled 2015-04-28: qty 0.2

## 2015-04-28 MED ORDER — INSULIN DETEMIR 100 UNIT/ML FLEXPEN: 30.0000 [IU] | PEN_INJECTOR | Freq: Every day | SUBCUTANEOUS | Status: DC

## 2015-04-28 MED ORDER — INSULIN ASPART 100 UNIT/ML ~~LOC~~ SOLN: 3.0000 [IU] | Freq: Three times a day (TID) | SUBCUTANEOUS | Status: AC

## 2015-04-28 MED ORDER — INSULIN DETEMIR 100 UNIT/ML ~~LOC~~ SOLN: 30.0000 [IU] | Freq: Every day | SUBCUTANEOUS | Status: AC

## 2015-04-28 MED ORDER — INSULIN DETEMIR 100 UNIT/ML ~~LOC~~ SOLN: 20.0000 [IU] | Freq: Every day | SUBCUTANEOUS | Status: DC

## 2015-04-28 MED ORDER — INSULIN DETEMIR 100 UNIT/ML ~~LOC~~ SOLN: 30.0000 [IU] | Freq: Every day | SUBCUTANEOUS | Status: DC

## 2015-04-28 MED ORDER — INSULIN DETEMIR 100 UNIT/ML FLEXPEN
30.0000 [IU] | PEN_INJECTOR | Freq: Every day | SUBCUTANEOUS | Status: DC
Start: 2015-04-28 — End: 2015-04-28

## 2015-04-28 NOTE — Telephone Encounter (Signed)
Received a calll from local Pharmacy that newly diagnosed Diabetic is unable to afford Insulin Pens as prescribed. Pt has f/u appt at [facility] on [facility] 8/24 for Follow up and Prescription coverage but they are not open today as it is Saturday. Spoke with MD who will order 1 pen each of Levemir and Novolog along with Needles needed to get her through until Monday. CM will provide MATCH program to cover these pens along with GoodRX coupons  Which should equal about $150.00. I opted for this as she is newly diagnosed and I am unsure of her ability to draw up her own insulin safely.  She will be instructed by Dellie Burns, the pharmacist at [facility],  to present the prescriptions she has in hand,  to the clinic at her appointment for refill as the Pens she will be given today will not be refillable. I have asked that she be given a copy of her MATCH letter.                  Dear [name]:  You have been approved to have the prescriptions written by your discharging physician filled through our [facility] (Medication [facility] [place] [place]) program. This program allows for a one-time (no refills) 34-day supply of selected medications for a low copay amount.  The copay is $3.00 per prescription. For instance, if you have one prescription, you will pay $3.00; for two prescriptions, you pay $6.00; for three prescriptions, you pay $9.00; and so on.  Only certain pharmacies are participating in this program with [facility]. You will need to select one of the pharmacies from the attached list and take your prescriptions, this letter, and your photo ID to one of the participating pharmacies.   We are excited that you are able to use the [facility] program to get your medications. These prescriptions must be filled within 7 days of hospital discharge or they will no longer be valid for the [facility] program. Should you have any problems with your prescriptions please contact your case management team  member at [phone].  Thank you, Care Management   [place]

## 2015-04-28 NOTE — Telephone Encounter (Signed)
Pharmacy unable to break open a carton of (5) five flex pens to provide for the pt. Will need to speak to the MD and get order for Vials Of both Levemir and Novolog Insulin, along with Syringes.

## 2015-04-28 NOTE — Telephone Encounter (Signed)
Feliz-Ortiz, MD will order individual vials of Levemir and Novolog Insulin, as well as Insulin syringes for pt to use until she can go [facility]. Will place this pt on my call list to f/u with in the am and assess if needs assist with Insulin injections etc..Marland Kitchen

## 2015-04-28 NOTE — Discharge Summary (Signed)
Physician Discharge Summary  Cathy Waters ZOX:[id number] DOB: September 28, 1965 DOA: 04/24/2015  PCP: No primary care provider on file.  Admit date: 04/24/2015 Discharge date: 04/28/2015  Time spent: 35 minutes  Recommendations for Outpatient Follow-up:  1. [facility], follow-up on an anti-glutamic assay  Discharge Diagnoses:  Principal Problem:   DKA, type 2 Active Problems:   Dizziness   ARF (acute renal failure)   DKA (diabetic ketoacidoses)   AKI (acute kidney injury)   Discharge Condition: stable  Diet recommendation: carb modififed  Filed Weights   04/24/15 2115  Weight: 112.038 kg (247 lb)    History of present illness:  49 year old female with no significant past medical history that comes in for blurry vision polyuria and polydipsia, nausea and vomiting for the last several days.  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 Once her bicarbonate was improved she was overlap with Lantus and change to sliding scale insulin. She will go home and check her sugars 4 times a day and continue long-acting insulin +2 units of NovoLog with meals.  Orthostatic hypotension: Likely due to decreased intravascular volume now resolved.  Acute kidney injury: Likely prerenal in etiology was soft with IV hydration.  Nausea and vomiting: Resolved likely due to DKA.  Hypovolemic hyponatremia: Resolved with IV hydration.   Procedures:  CXR  Consultations:  renal  Discharge Exam: Filed Vitals:   04/28/15 0413  BP: 116/74  Pulse: 84  Temp: 98.3 F (36.8 C)  Resp: 18    General: A&O x3 Cardiovascular: RRR Respiratory: good air movement CTA B/L  Discharge Instructions   Discharge Instructions    Diet - low sodium heart healthy    Complete by:  As directed      For home use only DME Glucometer    Complete  by:  As directed      Increase activity slowly    Complete by:  As directed           Current Discharge Medication List    START taking these medications   Details  insulin aspart (NOVOLOG FLEXPEN) 100 UNIT/ML FlexPen Inject 3 Units into the skin 3 (three) times daily with meals. Qty: 15 mL, Refills: 11    Insulin Detemir (LEVEMIR FLEXPEN) 100 UNIT/ML Pen Inject 30 Units into the skin daily at 10 pm. Qty: 2 pen, Refills: 2    Insulin Pen Needle 31G X 6 MM MISC 1 Device by Does not apply route 2 (two) times daily. Qty: 60 each, Refills: 3      STOP taking these medications     metFORMIN (GLUCOPHAGE) 500 MG tablet      ranitidine (ZANTAC) 150 MG tablet        No Known Allergies Follow-up Information    Follow up with [facility] On 05/02/2015.   Why:  at 8:15am   Contact information:   [address] [place] [phone]       The results of significant diagnostics from this hospitalization (including imaging, microbiology, ancillary and laboratory) are listed below for reference.    Significant Diagnostic Studies: Ct Head Wo Contrast  04/25/2015   CLINICAL DATA:  Subacute onset of dizziness for several weeks. Initial encounter.  EXAM: CT HEAD WITHOUT CONTRAST  TECHNIQUE: Contiguous axial images were obtained from the base of the skull  through the vertex without intravenous contrast.  COMPARISON:  None.  FINDINGS: There is no evidence of acute infarction, mass lesion, or intra- or extra-axial hemorrhage on CT.  Mild periventricular white matter change may reflect small vessel ischemic microangiopathy.  The posterior fossa, including the cerebellum, brainstem and fourth ventricle, is within normal limits. The third and lateral ventricles, and basal ganglia are unremarkable in appearance. The cerebral hemispheres are symmetric in appearance, with normal gray-white differentiation. No mass effect or midline shift is seen.  There is  no evidence of fracture; visualized osseous structures are unremarkable in appearance. The orbits are within normal limits. The paranasal sinuses and mastoid air cells are well-aerated. No significant soft tissue abnormalities are seen.  IMPRESSION: 1. No acute intracranial pathology seen on CT. 2. Suggestion of mild small vessel ischemic microangiopathy.   Electronically Signed   By: Roanna Raider M.D.   On: 04/25/2015 02:26   Dg Chest Port 1 View  04/25/2015   CLINICAL DATA:  49 year old female with hyperglycemia, chest pain. Initial encounter.  EXAM: PORTABLE CHEST - 1 VIEW  COMPARISON:  None.  FINDINGS: Portable AP upright view at 0632 hours. Low lung volumes. The lungs remain clear. Normal cardiac size and mediastinal contours. No pneumothorax or pleural effusion.  IMPRESSION: Low lung volumes, otherwise no acute cardiopulmonary abnormality.   Electronically Signed   By: Odessa Fleming M.D.   On: 04/25/2015 08:04    Microbiology: No results found for this or any previous visit (from the past 240 hour(s)).   Labs: Basic Metabolic Panel:  Recent Labs Lab 04/27/15 1432 04/27/15 1841 04/27/15 2215 04/28/15 0228 04/28/15 0600  NA 134* 135 136 135 136  K 3.5 3.3* 3.3* 3.2* 3.3*  CL 106 104 104 103 103  CO2 18* 19* 23 21* 22  GLUCOSE 315* 299* 278* 252* 261*  BUN <5* <5* <5* <5* 5*  CREATININE 0.85 0.83 0.90 0.82 0.87  CALCIUM 8.5* 8.5* 9.0 8.6* 8.4*   Liver Function Tests:  Recent Labs Lab 04/25/15 0258  AST 26  ALT 35  ALKPHOS 96  BILITOT 1.0  PROT 7.2  ALBUMIN 3.8   No results for input(s): LIPASE, AMYLASE in the last 168 hours. No results for input(s): AMMONIA in the last 168 hours. CBC:  Recent Labs Lab 04/25/15 0258  WBC 6.5  NEUTROABS 3.5  HGB 15.1*  HCT 42.4  MCV 86.0  PLT 193   Cardiac Enzymes:  Recent Labs Lab 04/25/15 0258 04/25/15 0800  TROPONINI <0.03 <0.03   BNP: BNP (last 3 results) No results for input(s): BNP in the last 8760 hours.  ProBNP  (last 3 results) No results for input(s): PROBNP in the last 8760 hours.  CBG:  Recent Labs Lab 04/28/15 0004 04/28/15 0207 04/28/15 0410 04/28/15 0604 04/28/15 0731  GLUCAP 244* 238* 261* 206* 241*      Signed:  Marinda Elk  [facility] 04/28/2015, 8:19 AM

## 2015-04-28 NOTE — Progress Notes (Signed)
MATCH program and GoodRx assist

## 2015-04-29 ENCOUNTER — Telehealth: Payer: Self-pay | Admitting: *Deleted

## 2015-05-02 ENCOUNTER — Encounter: Payer: Self-pay | Admitting: Family Medicine

## 2015-05-02 ENCOUNTER — Ambulatory Visit (INDEPENDENT_AMBULATORY_CARE_PROVIDER_SITE_OTHER): Payer: Self-pay | Admitting: Family Medicine

## 2015-05-02 VITALS — BP 122/66 | HR 74 | Temp 98.1°F | Resp 16 | Ht 69.0 in | Wt 256.0 lb

## 2015-05-02 DIAGNOSIS — E118 Type 2 diabetes mellitus with unspecified complications: Secondary | ICD-10-CM

## 2015-05-02 DIAGNOSIS — Z23 Encounter for immunization: Secondary | ICD-10-CM

## 2015-05-02 DIAGNOSIS — Z0000 Encounter for general adult medical examination without abnormal findings: Secondary | ICD-10-CM

## 2015-05-02 LAB — COMPLETE METABOLIC PANEL WITH GFR
ALT: 46 U/L — AB (ref 6–29)
AST: 45 U/L — AB (ref 10–35)
Albumin: 3.5 g/dL — ABNORMAL LOW (ref 3.6–5.1)
Alkaline Phosphatase: 67 U/L (ref 33–115)
BILIRUBIN TOTAL: 0.4 mg/dL (ref 0.2–1.2)
BUN: 7 mg/dL (ref 7–25)
CALCIUM: 8.3 mg/dL — AB (ref 8.6–10.2)
CHLORIDE: 103 mmol/L (ref 98–110)
CO2: 24 mmol/L (ref 20–31)
CREATININE: 0.68 mg/dL (ref 0.50–1.10)
GFR, Est African American: >89 mL/min (ref >=60)
GFR, Est Non African American: >89 mL/min (ref >=60)
Glucose, Bld: 175 mg/dL — ABNORMAL HIGH (ref 65–99)
Potassium: 3.2 mmol/L — ABNORMAL LOW (ref 3.5–5.3)
Sodium: 139 mmol/L (ref 135–146)
TOTAL PROTEIN: 5.7 g/dL — AB (ref 6.1–8.1)

## 2015-05-02 LAB — LIPID PANEL
Cholesterol: 132 mg/dL (ref 125–200)
HDL: 40 mg/dL — ABNORMAL LOW (ref >=46)
LDL CALC: 67 mg/dL (ref <130)
Total CHOL/HDL Ratio: 3.3 Ratio (ref <=5.0)
Triglycerides: 127 mg/dL (ref <150)
VLDL: 25 mg/dL (ref <30)

## 2015-05-02 LAB — HEMOGLOBIN A1C
HEMOGLOBIN A1C: 10.8 % — AB (ref <5.7)
Mean Plasma Glucose: 263 mg/dL — ABNORMAL HIGH (ref <117)

## 2015-05-02 LAB — GLUCOSE, CAPILLARY: Glucose-Capillary: 157 mg/dL — ABNORMAL HIGH (ref 65–99)

## 2015-05-02 MED ORDER — OMEPRAZOLE 20 MG PO CPDR: 20.0000 mg | DELAYED_RELEASE_CAPSULE | Freq: Every day | ORAL | Status: AC

## 2015-05-02 MED ORDER — PRAVASTATIN SODIUM 40 MG PO TABS: 40.0000 mg | ORAL_TABLET | Freq: Every day | ORAL | Status: AC

## 2015-05-02 MED ORDER — OMEPRAZOLE 20 MG PO CPDR: 20.0000 mg | DELAYED_RELEASE_CAPSULE | Freq: Every day | ORAL | Status: DC

## 2015-05-02 MED ORDER — PRAVASTATIN SODIUM 40 MG PO TABS: 40.0000 mg | ORAL_TABLET | Freq: Every day | ORAL | Status: DC

## 2015-05-02 MED ORDER — LISINOPRIL 2.5 MG PO TABS: 2.5000 mg | ORAL_TABLET | Freq: Every day | ORAL | Status: AC

## 2015-05-02 NOTE — Progress Notes (Signed)
Patient ID: Cathy Waters female   DOB: 1966-03-30, 49 y.o.   MRN: [id number]   Cathy Waters, is a 49 y.o. female  NWG:[id number]  [id number]:[id number]  DOB - Apr 16, 1966  CC:  Chief Complaint  Patient presents with  . Establish Care       HPI: Cathy Waters is a 49 y.o. female here to establish care. She has not had regular health care in a couple of years due to the lack of insurance. She was recently hospitalized from 8/16-8/20, was diagnosed with type II diabetes and send home on Lantus 20 mg as HS and 2 units of Novolog with meals as well as metformin 500 bid.  She is interested in trying to control with only oral medications. Her admitting BS was 315, 261 at discharge. Today was 157. She had 30 units of Levemir last night and 2 units of Novolog this am with breakfast as well as her metformin. She also has a problem with acid reflux and needs a medication for that. She is not on an ACE or statin. Her only medications prior to today were the three medications listed above for diabetes.    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 Once her bicarbonate was improved she was overlap with Lantus and change to sliding scale insulin. She will go home and check her sugars 4 times a day and continue long-acting insulin +2 units of NovoLog with meals.  Orthostatic hypotension: Likely due to decreased intravascular volume now resolved.  Acute kidney injury: Likely prerenal in etiology was soft with IV hydration.  Nausea and vomiting: Resolved likely due to DKA.  Hypovolemic hyponatremia: Resolved with IV hydration.   No Known Allergies Past Medical History  Diagnosis Date  . Acid reflux   . Medical history non-contributory   . Diabetes mellitus without complication    Current Outpatient Prescriptions on File Prior to Visit  Medication Sig  Dispense Refill  . insulin aspart (NOVOLOG) 100 UNIT/ML injection Inject 3 Units into the skin 3 (three) times daily before meals. 10 mL 11  . insulin detemir (LEVEMIR) 100 UNIT/ML injection Inject 0.3 mLs (30 Units total) into the skin at bedtime. 10 mL 11  . Insulin Syringe-Needle U-100 (B-D INS SYRINGE 2CC/29GX1/2") 29G X 1/2" 2 ML MISC 30 Syringes by Does not apply route 4 (four) times daily. 30 each 0   No current facility-administered medications on file prior to visit.   Family History  Problem Relation Age of Onset  . Diabetes Mellitus II Maternal Uncle   . Cancer Mother   . Cancer Father    Social History   Social History  . Marital Status: Married    Spouse Name: N/A  . Number of Children: N/A  . Years of Education: N/A   Occupational History  . Not on file.   Social History Main Topics  . Smoking status: Never Smoker   . Smokeless tobacco: Not on file  . Alcohol Use: No  . Drug Use: No  . Sexual Activity: Not on file   Other Topics Concern  . Not on file   Social History Narrative    Review of Systems: Constitutional: Negative for fever, chills, appetite change, weight loss,  Fatigue. Skin: History of ecezma HENT: Negative for ear pain, ear discharge.nose bleeds Eyes: Negative for pain, discharge, redness, itching and visual disturbance. Neck: Negative  for pain, stiffness Respiratory: Negative for cough, shortness of breath,   Cardiovascular: Negative for chest pain, palpitations.Positive for occassional swelling of ankles Gastrointestinal: Negative for abdominal distention, abdominal pain, nausea, vomiting, diarrhea, constipations.Positive for heartburn Genitourinary: Negative for dysuria, urgency, frequency, hematuria, flank pain,  Musculoskeletal: Negative for back pain, joint pain, joint  swelling, arthralgia and gait problem.Negative for weakness. Neurological: Negative for dizziness, tremors, seizures, syncope,   light-headedness, numbness and  headaches.  Hematological: Negative for easy bruising or bleeding Psychiatric/Behavioral: Negative for depression, anxiety, decreased concentration, confusion    Objective:   Filed Vitals:   05/02/15 0828  BP: 122/66  Pulse: 74  Temp: 98.1 F (36.7 C)  Resp: 16    Physical Exam: Constitutional: Patient appears well-developed and well-nourished. No distress. HENT: Normocephalic, atraumatic, External right and left ear normal. Oropharynx is clear and moist.  Eyes: Conjunctivae and EOM are normal. PERRLA, no scleral icterus. Neck: Normal ROM. Neck supple. No lymphadenopathy, No thyromegaly. CVS: RRR, S1/S2 +, no murmurs, no gallops, no rubs Pulmonary: Effort and breath sounds normal, no stridor, rhonchi, wheezes, rales.  Abdominal: Soft. Normoactive BS,, no distension, tenderness, rebound or guarding.  Musculoskeletal: Normal range of motion. No edema and no tenderness.  Neuro: Alert.Normal muscle tone coordination. Non-focal Skin: Skin is warm and dry. Not diaphoretic. No erythema. No pallor. Psychiatric: Normal mood and affect. Behavior, judgment, thought content normal.  Lab Results  Component Value Date   WBC 6.5 04/25/2015   HGB 15.1* 04/25/2015   HCT 42.4 04/25/2015   MCV 86.0 04/25/2015   PLT 193 04/25/2015   Lab Results  Component Value Date   CREATININE 0.87 04/28/2015   BUN 5* 04/28/2015   NA 136 04/28/2015   K 3.3* 04/28/2015   CL 103 04/28/2015   CO2 22 04/28/2015    No results found for: HGBA1C Lipid Panel  No results found for: CHOL, TRIG, HDL, CHOLHDL, VLDL, LDLCALC     Assessment and plan:   Visit to establish care -I have reviewed information provided by the patient and hospital notes from recent admission -I have review health maintenance items -Tdap given today. -Vitamin D -Advised pneumonia vaccine. -She will provide copy of her last PAP about a year ago. -CBC in hospital, no repeat needed.   New onset Diabetes, Type II -Have reviewed  hospital course and medications -Cmet with GFr -A1C Blood glucose - 157 Have provided diabetic diet information and reviewed important aspects of diet. -Once I have A1C results, will consider treating only with oral medications.   Acute kidney injury -Repeat Cmet with GFr.  Follow-up one month.  The patient was given clear instructions to go to ER or return to medical center if symptoms don't improve, worsen or new problems develop. The patient verbalized understanding.      Henrietta Hoover, MSN, FNP-BC   05/02/2015, 3:11 PM

## 2015-05-02 NOTE — Patient Instructions (Addendum)
You automatically need to be on a medication for cholesterol when you have diabetes. Also on a low does of blood pressure medicine to protect your kidneys. I am providing you a copy of a diabetic diet and putting in a referral for diabetic education. Some one will call you about that Avoid concentrated sweets and lots of white foods (carbs). Eat more fresh fruits and vegetables and lean meats that are broil, baked or boiled (BBB) Try to walk 30 minutes at least 5-7 days. Attempt weight loss by reducing calories. I will let you know tomorrow or Monday about your metformin and insulin. Try to get a copy of your last PAP so we can determine when you need a repeat.   Diabetes Mellitus and Food It is important for you to manage your blood sugar (glucose) level. Your blood glucose level can be greatly affected by what you eat. Eating healthier foods in the appropriate amounts throughout the day at about the same time each day will help you control your blood glucose level. It can also help slow or prevent worsening of your diabetes mellitus. Healthy eating may even help you improve the level of your blood pressure and reach or maintain a healthy weight.  HOW CAN FOOD AFFECT ME? Carbohydrates Carbohydrates affect your blood glucose level more than any other type of food. Your dietitian will help you determine how many carbohydrates to eat at each meal and teach you how to count carbohydrates. Counting carbohydrates is important to keep your blood glucose at a healthy level, especially if you are using insulin or taking certain medicines for diabetes mellitus. Alcohol Alcohol can cause sudden decreases in blood glucose (hypoglycemia), especially if you use insulin or take certain medicines for diabetes mellitus. Hypoglycemia can be a life-threatening condition. Symptoms of hypoglycemia (sleepiness, dizziness, and disorientation) are similar to symptoms of having too much alcohol.  If your health care  provider has given you approval to drink alcohol, do so in moderation and use the following guidelines:  Women should not have more than one drink per day, and men should not have more than two drinks per day. One drink is equal to:  12 oz of beer.  5 oz of wine.  1 oz of hard liquor.  Do not drink on an empty stomach.  Keep yourself hydrated. Have water, diet soda, or unsweetened iced tea.  Regular soda, juice, and other mixers might contain a lot of carbohydrates and should be counted. WHAT FOODS ARE NOT RECOMMENDED? As you make food choices, it is important to remember that all foods are not the same. Some foods have fewer nutrients per serving than other foods, even though they might have the same number of calories or carbohydrates. It is difficult to get your body what it needs when you eat foods with fewer nutrients. Examples of foods that you should avoid that are high in calories and carbohydrates but low in nutrients include:  Trans fats (most processed foods list trans fats on the Nutrition Facts label).  Regular soda.  Juice.  Candy.  Sweets, such as cake, pie, doughnuts, and cookies.  Fried foods. WHAT FOODS CAN I EAT? Have nutrient-rich foods, which will nourish your body and keep you healthy. The food you should eat also will depend on several factors, including:  The calories you need.  The medicines you take.  Your weight.  Your blood glucose level.  Your blood pressure level.  Your cholesterol level. You also should eat a variety of  foods, including:  Protein, such as meat, poultry, fish, tofu, nuts, and seeds (lean animal proteins are best).  Fruits.  Vegetables.  Dairy products, such as milk, cheese, and yogurt (low fat is best).  Breads, grains, pasta, cereal, rice, and beans.  Fats such as olive oil, trans fat-free margarine, canola oil, avocado, and olives. DOES EVERYONE WITH DIABETES MELLITUS HAVE THE SAME MEAL PLAN? Because every  person with diabetes mellitus is different, there is not one meal plan that works for everyone. It is very important that you meet with a dietitian who will help you create a meal plan that is just right for you. Document Released: 05/22/2005 Document Revised: 08/30/2013 Document Reviewed: 07/22/2013 [facility] Patient Information 2015 [place], [place]. This information is not intended to replace advice given to you by your health care provider. Make sure you discuss any questions you have with your health care provider.

## 2015-05-03 ENCOUNTER — Other Ambulatory Visit: Payer: Self-pay | Admitting: Family Medicine

## 2015-05-03 ENCOUNTER — Telehealth: Payer: Self-pay

## 2015-05-03 LAB — VITAMIN D 25 HYDROXY (VIT D DEFICIENCY, FRACTURES): Vit D, 25-Hydroxy: 13 ng/mL — ABNORMAL LOW (ref 30–100)

## 2015-05-03 MED ORDER — VITAMIN D (ERGOCALCIFEROL) 1.25 MG (50000 UNIT) PO CAPS: 50000.0000 [IU] | ORAL_CAPSULE | ORAL | Status: AC

## 2015-05-03 NOTE — Telephone Encounter (Signed)
Left message regarding abnormal lab results gave directions regarding increasing metformin to 2 tablets once daily and starting vitamin D supplement as prescribed. Asked patient to check blood sugar as fasting, before each meal and at bedtime and to bring meter in at 2 week follow up. Gave instructions if she had any questions to return call to our office. Thanks!

## 2015-05-03 NOTE — Telephone Encounter (Signed)
-----   Message from Henrietta Hoover, NP sent at 05/03/2015  8:23 AM EDT ----- A1C 10.8. Diabetes not in good control. Vitamin D low. Potassium low.Liver functions slightly elevated.Kidney function ok. For 2 weeks, take 2 of your metformin once a day and check your blood sugars fasting in am, before each meal and at bedtime. Bring in your record and meter in 2 weeks and we will determine if you can just stay on metformin or if we need to add something else or go back to insulin. Am sending in a priscription for Vitamin D.

## 2015-05-04 NOTE — Telephone Encounter (Signed)
Confirmed thT PT HAD RECEIVED MEDS.

## 2015-05-09 ENCOUNTER — Telehealth: Payer: Self-pay | Admitting: Family Medicine

## 2015-05-09 NOTE — Telephone Encounter (Signed)
Patient left message complaining of swollen feet.

## 2015-05-10 NOTE — Telephone Encounter (Signed)
Left message for patient to return call 05/10/2015 :33am. Thanks!

## 2015-05-15 ENCOUNTER — Ambulatory Visit: Payer: Self-pay

## 2015-05-22 ENCOUNTER — Ambulatory Visit: Payer: Self-pay

## 2015-06-01 ENCOUNTER — Ambulatory Visit: Payer: Self-pay | Admitting: Family Medicine

## 2017-01-28 IMAGING — CR DG CHEST 1V PORT
1 series · 1 of 1 positions shown · non-contrast
Comparison: None.

CLINICAL DATA: 49-year-old female with hyperglycemia, chest pain.
Initial encounter.

EXAM:
PORTABLE CHEST - 1 VIEW

[AP]
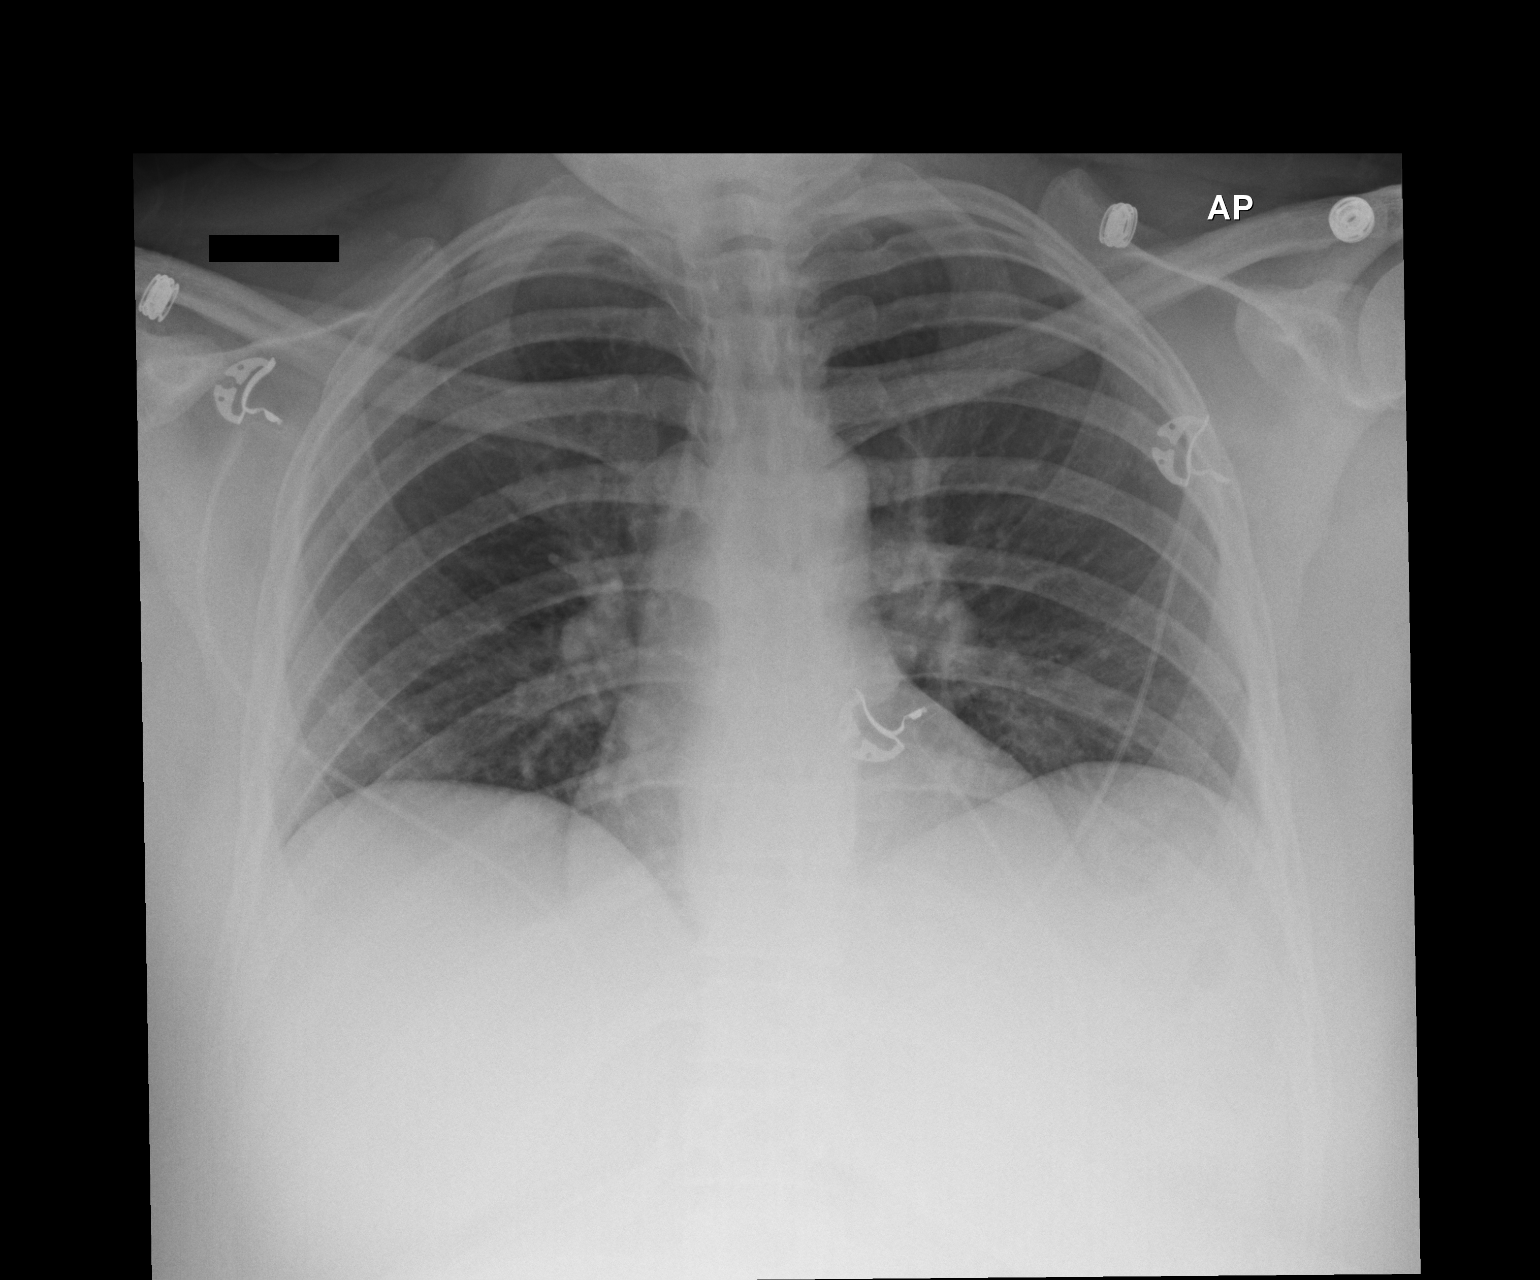

[1 of 1 positions shown; findings below may reference images not displayed]

FINDINGS: Portable AP upright view at 7937 hours. Low lung volumes. The lungs
remain clear. Normal cardiac size and mediastinal contours. No
pneumothorax or pleural effusion.
IMPRESSION: Low lung volumes, otherwise no acute cardiopulmonary abnormality.

## 2019-12-05 ENCOUNTER — Ambulatory Visit: Payer: Self-pay | Attending: Internal Medicine

## 2019-12-05 DIAGNOSIS — Z23 Encounter for immunization: Secondary | ICD-10-CM

## 2019-12-05 NOTE — Progress Notes (Signed)
° °  Covid-19 Vaccination Clinic  Name:  Cathy Waters    MRN: [id number] DOB: Nov 08, 1965  12/05/2019  [name] was observed post Covid-19 immunization for 15 minutes without incident. She was provided with Vaccine Information Sheet and instruction to access the V-Safe system.   [name] was instructed to call 911 with any severe reactions post vaccine:  Difficulty breathing   Swelling of face and throat   A fast heartbeat   A bad rash all over body   Dizziness and weakness   Immunizations Administered    Name Date Dose VIS Date Route   Pfizer COVID-19 Vaccine 12/05/2019  1:03 PM 0.3 mL 08/19/2019 Intramuscular   Manufacturer: [employer], [employer]   Lot: [id number]   NDC: [address]-6

## 2019-12-27 ENCOUNTER — Ambulatory Visit: Payer: Self-pay | Attending: Internal Medicine

## 2019-12-27 DIAGNOSIS — Z23 Encounter for immunization: Secondary | ICD-10-CM

## 2019-12-27 NOTE — Progress Notes (Signed)
   Covid-19 Vaccination Clinic  Name:  Cathy Waters    MRN: [id number] DOB: [birth year]  12/27/2019  Cathy Waters was observed post Covid-19 immunization for 15 minutes without incident. She was provided with Vaccine Information Sheet and instruction to access the V-Safe system.   Cathy Waters was instructed to call 911 with any severe reactions post vaccine: Marland Kitchen Difficulty breathing  . Swelling of face and throat  . A fast heartbeat  . A bad rash all over body  . Dizziness and weakness   Immunizations Administered    Name Date Dose VIS Date Route   Pfizer COVID-19 Vaccine 12/27/2019  1:29 PM 0.3 mL 11/02/2018 Intramuscular   Manufacturer: [employer], [employer]   Lot: [id number]   NDC: [address]-8
